# Patient Record
Sex: Female | Born: 1940 | Race: White | Hispanic: No | Marital: Married | Smoking: Never smoker
Health system: Southern US, Community
[De-identification: ages and names within clinical notes are randomized; demographics above are authoritative.]

## PROBLEM LIST (undated history)

## (undated) DIAGNOSIS — Z9889 Other specified postprocedural states: Secondary | ICD-10-CM

## (undated) DIAGNOSIS — T8859XA Other complications of anesthesia, initial encounter: Secondary | ICD-10-CM

## (undated) DIAGNOSIS — I10 Essential (primary) hypertension: Secondary | ICD-10-CM

## (undated) DIAGNOSIS — C801 Malignant (primary) neoplasm, unspecified: Secondary | ICD-10-CM

## (undated) DIAGNOSIS — E785 Hyperlipidemia, unspecified: Secondary | ICD-10-CM

## (undated) DIAGNOSIS — R112 Nausea with vomiting, unspecified: Secondary | ICD-10-CM

## (undated) DIAGNOSIS — T4145XA Adverse effect of unspecified anesthetic, initial encounter: Secondary | ICD-10-CM

## (undated) HISTORY — PX: WRIST SURGERY: SHX841

---

## 2006-07-02 ENCOUNTER — Other Ambulatory Visit: Admission: RE | Admit: 2006-07-02 | Discharge: 2006-07-02 | Payer: Self-pay | Admitting: Family Medicine

## 2009-03-06 ENCOUNTER — Other Ambulatory Visit: Admission: RE | Admit: 2009-03-06 | Discharge: 2009-03-06 | Payer: Self-pay | Admitting: Family Medicine

## 2012-10-24 ENCOUNTER — Emergency Department (HOSPITAL_COMMUNITY): Payer: Medicare Other

## 2012-10-24 ENCOUNTER — Emergency Department (HOSPITAL_COMMUNITY)
Admission: EM | Admit: 2012-10-24 | Discharge: 2012-10-24 | Disposition: A | Discharge status: Home / Self Care | Payer: Medicare Other | Attending: Emergency Medicine | Admitting: Emergency Medicine

## 2012-10-24 ENCOUNTER — Encounter (HOSPITAL_COMMUNITY): Payer: Self-pay | Admitting: *Deleted

## 2012-10-24 DIAGNOSIS — Z7982 Long term (current) use of aspirin: Secondary | ICD-10-CM | POA: Insufficient documentation

## 2012-10-24 DIAGNOSIS — E785 Hyperlipidemia, unspecified: Secondary | ICD-10-CM | POA: Insufficient documentation

## 2012-10-24 DIAGNOSIS — Z79899 Other long term (current) drug therapy: Secondary | ICD-10-CM | POA: Insufficient documentation

## 2012-10-24 DIAGNOSIS — Y939 Activity, unspecified: Secondary | ICD-10-CM | POA: Insufficient documentation

## 2012-10-24 DIAGNOSIS — I10 Essential (primary) hypertension: Secondary | ICD-10-CM | POA: Insufficient documentation

## 2012-10-24 DIAGNOSIS — Y929 Unspecified place or not applicable: Secondary | ICD-10-CM | POA: Insufficient documentation

## 2012-10-24 DIAGNOSIS — S52609A Unspecified fracture of lower end of unspecified ulna, initial encounter for closed fracture: Secondary | ICD-10-CM

## 2012-10-24 HISTORY — DX: Hyperlipidemia, unspecified: E78.5

## 2012-10-24 HISTORY — DX: Essential (primary) hypertension: I10

## 2012-10-24 LAB — BASIC METABOLIC PANEL
BUN: 17 mg/dL (ref 6–23)
Calcium: 9.5 mg/dL (ref 8.4–10.5)
Creatinine, Ser: 0.87 mg/dL (ref 0.50–1.10)
GFR calc Af Amer: 76 mL/min — ABNORMAL LOW (ref >90)
GFR calc non Af Amer: 65 mL/min — ABNORMAL LOW (ref >90)
Glucose, Bld: 106 mg/dL — ABNORMAL HIGH (ref 70–99)
Potassium: 3.6 mEq/L (ref 3.5–5.1)

## 2012-10-24 LAB — CBC WITH DIFFERENTIAL/PLATELET
Basophils Relative: 0 % (ref 0–1)
Eosinophils Absolute: 0 10*3/uL (ref 0.0–0.7)
Eosinophils Relative: 0 % (ref 0–5)
Hemoglobin: 14.4 g/dL (ref 12.0–15.0)
Lymphs Abs: 1.3 10*3/uL (ref 0.7–4.0)
MCH: 29.9 pg (ref 26.0–34.0)
MCHC: 34.7 g/dL (ref 30.0–36.0)
MCV: 86.3 fL (ref 78.0–100.0)
Monocytes Absolute: 0.6 10*3/uL (ref 0.1–1.0)
Monocytes Relative: 5 % (ref 3–12)
Neutrophils Relative %: 85 % — ABNORMAL HIGH (ref 43–77)
RBC: 4.81 MIL/uL (ref 3.87–5.11)

## 2012-10-24 MED ORDER — MORPHINE SULFATE 2 MG/ML IJ SOLN
1.0000 mg | Freq: Once | INTRAMUSCULAR | Status: AC
  Administered 2012-10-24: 1 mg via INTRAVENOUS
  Filled 2012-10-24: qty 1

## 2012-10-24 MED ORDER — ONDANSETRON HCL 4 MG PO TABS: 4.0000 mg | ORAL_TABLET | Freq: Four times a day (QID) | ORAL | Status: DC

## 2012-10-24 MED ORDER — HYDROCODONE-ACETAMINOPHEN 5-500 MG PO TABS: 1.0000 | ORAL_TABLET | Freq: Four times a day (QID) | ORAL | Status: DC | PRN

## 2012-10-24 MED ORDER — ONDANSETRON HCL 4 MG/2ML IJ SOLN
4.0000 mg | Freq: Once | INTRAMUSCULAR | Status: AC
  Administered 2012-10-24: 4 mg via INTRAVENOUS
  Filled 2012-10-24: qty 2

## 2012-10-24 NOTE — Progress Notes (Signed)
Orthopedic Tech Progress Note Patient Details:  Norma Simon 1940/12/27 [id number]  Ortho Devices Type of Ortho Device: Long arm splint;Arm foam sling Ortho Device/Splint Location: right arm Ortho Device/Splint Interventions: Application   Norma Simon 10/24/2012, 4:16 PM

## 2012-10-24 NOTE — ED Notes (Signed)
The pt has bruising and swelling to the rt wrist and hand with scattered abrasions.  Ring removed from her rt ring finger and given to the daughter.  Ice remains on the  Hand and forearm
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
## 2012-10-25 NOTE — ED Provider Notes (Signed)
Medical screening examination/treatment/procedure(s) were performed by non-physician practitioner and as supervising physician I was immediately available for consultation/collaboration.  Ethelda Chick, MD 10/25/12 [phone]

## 2012-12-15 ENCOUNTER — Ambulatory Visit: Payer: Medicare Other | Attending: Orthopedic Surgery | Admitting: Physical Therapy

## 2012-12-15 DIAGNOSIS — IMO0001 Reserved for inherently not codable concepts without codable children: Secondary | ICD-10-CM | POA: Insufficient documentation

## 2012-12-15 DIAGNOSIS — M25539 Pain in unspecified wrist: Secondary | ICD-10-CM | POA: Insufficient documentation

## 2012-12-15 DIAGNOSIS — M25639 Stiffness of unspecified wrist, not elsewhere classified: Secondary | ICD-10-CM | POA: Insufficient documentation

## 2012-12-22 ENCOUNTER — Ambulatory Visit: Payer: Medicare Other | Attending: Orthopedic Surgery | Admitting: Physical Therapy

## 2012-12-22 DIAGNOSIS — IMO0001 Reserved for inherently not codable concepts without codable children: Secondary | ICD-10-CM | POA: Insufficient documentation

## 2012-12-22 DIAGNOSIS — M25639 Stiffness of unspecified wrist, not elsewhere classified: Secondary | ICD-10-CM | POA: Insufficient documentation

## 2012-12-22 DIAGNOSIS — M25539 Pain in unspecified wrist: Secondary | ICD-10-CM | POA: Insufficient documentation

## 2012-12-28 ENCOUNTER — Ambulatory Visit: Payer: Medicare Other | Admitting: Physical Therapy

## 2013-01-04 ENCOUNTER — Ambulatory Visit: Payer: Medicare Other | Admitting: Physical Therapy

## 2016-06-10 DIAGNOSIS — I129 Hypertensive chronic kidney disease with stage 1 through stage 4 chronic kidney disease, or unspecified chronic kidney disease: Secondary | ICD-10-CM | POA: Diagnosis not present

## 2016-06-10 DIAGNOSIS — N183 Chronic kidney disease, stage 3 (moderate): Secondary | ICD-10-CM | POA: Diagnosis not present

## 2016-06-10 DIAGNOSIS — E785 Hyperlipidemia, unspecified: Secondary | ICD-10-CM | POA: Diagnosis not present

## 2016-10-08 DIAGNOSIS — Z23 Encounter for immunization: Secondary | ICD-10-CM | POA: Diagnosis not present

## 2016-12-10 DIAGNOSIS — M8580 Other specified disorders of bone density and structure, unspecified site: Secondary | ICD-10-CM | POA: Diagnosis not present

## 2016-12-10 DIAGNOSIS — Z0000 Encounter for general adult medical examination without abnormal findings: Secondary | ICD-10-CM | POA: Diagnosis not present

## 2016-12-10 DIAGNOSIS — N183 Chronic kidney disease, stage 3 (moderate): Secondary | ICD-10-CM | POA: Diagnosis not present

## 2016-12-10 DIAGNOSIS — E559 Vitamin D deficiency, unspecified: Secondary | ICD-10-CM | POA: Diagnosis not present

## 2016-12-10 DIAGNOSIS — I129 Hypertensive chronic kidney disease with stage 1 through stage 4 chronic kidney disease, or unspecified chronic kidney disease: Secondary | ICD-10-CM | POA: Diagnosis not present

## 2016-12-10 DIAGNOSIS — E785 Hyperlipidemia, unspecified: Secondary | ICD-10-CM | POA: Diagnosis not present

## 2016-12-10 DIAGNOSIS — Z1211 Encounter for screening for malignant neoplasm of colon: Secondary | ICD-10-CM | POA: Diagnosis not present

## 2016-12-25 DIAGNOSIS — Z1211 Encounter for screening for malignant neoplasm of colon: Secondary | ICD-10-CM | POA: Diagnosis not present

## 2017-06-07 DIAGNOSIS — N183 Chronic kidney disease, stage 3 (moderate): Secondary | ICD-10-CM | POA: Diagnosis not present

## 2017-06-07 DIAGNOSIS — Z23 Encounter for immunization: Secondary | ICD-10-CM | POA: Diagnosis not present

## 2017-06-07 DIAGNOSIS — I129 Hypertensive chronic kidney disease with stage 1 through stage 4 chronic kidney disease, or unspecified chronic kidney disease: Secondary | ICD-10-CM | POA: Diagnosis not present

## 2017-06-07 DIAGNOSIS — E785 Hyperlipidemia, unspecified: Secondary | ICD-10-CM | POA: Diagnosis not present

## 2017-10-05 DIAGNOSIS — Z23 Encounter for immunization: Secondary | ICD-10-CM | POA: Diagnosis not present

## 2018-01-01 DIAGNOSIS — Z1231 Encounter for screening mammogram for malignant neoplasm of breast: Secondary | ICD-10-CM | POA: Diagnosis not present

## 2018-01-01 DIAGNOSIS — Z803 Family history of malignant neoplasm of breast: Secondary | ICD-10-CM | POA: Diagnosis not present

## 2018-01-04 DIAGNOSIS — I129 Hypertensive chronic kidney disease with stage 1 through stage 4 chronic kidney disease, or unspecified chronic kidney disease: Secondary | ICD-10-CM | POA: Diagnosis not present

## 2018-01-04 DIAGNOSIS — Z0000 Encounter for general adult medical examination without abnormal findings: Secondary | ICD-10-CM | POA: Diagnosis not present

## 2018-01-04 DIAGNOSIS — N183 Chronic kidney disease, stage 3 (moderate): Secondary | ICD-10-CM | POA: Diagnosis not present

## 2018-01-04 DIAGNOSIS — E785 Hyperlipidemia, unspecified: Secondary | ICD-10-CM | POA: Diagnosis not present

## 2018-01-10 DIAGNOSIS — N6324 Unspecified lump in the left breast, lower inner quadrant: Secondary | ICD-10-CM | POA: Diagnosis not present

## 2018-01-19 ENCOUNTER — Other Ambulatory Visit: Payer: Self-pay | Admitting: Radiology

## 2018-01-19 DIAGNOSIS — C50312 Malignant neoplasm of lower-inner quadrant of left female breast: Secondary | ICD-10-CM | POA: Diagnosis not present

## 2018-01-19 DIAGNOSIS — N6324 Unspecified lump in the left breast, lower inner quadrant: Secondary | ICD-10-CM | POA: Diagnosis not present

## 2018-01-21 ENCOUNTER — Telehealth: Payer: Self-pay | Admitting: Hematology and Oncology

## 2018-01-21 ENCOUNTER — Encounter: Payer: Self-pay | Admitting: *Deleted

## 2018-01-21 NOTE — Telephone Encounter (Signed)
LVM for patient for [facility] on 01/26/18

## 2018-01-25 ENCOUNTER — Other Ambulatory Visit: Payer: Self-pay | Admitting: *Deleted

## 2018-01-25 DIAGNOSIS — C50312 Malignant neoplasm of lower-inner quadrant of left female breast: Secondary | ICD-10-CM

## 2018-01-25 DIAGNOSIS — Z170 Estrogen receptor positive status [ER+]: Principal | ICD-10-CM

## 2018-01-26 ENCOUNTER — Encounter: Payer: Self-pay | Admitting: Physical Therapy

## 2018-01-26 ENCOUNTER — Ambulatory Visit: Payer: Medicare Other | Attending: General Surgery | Admitting: Physical Therapy

## 2018-01-26 ENCOUNTER — Inpatient Hospital Stay: Payer: Medicare Other | Attending: Hematology and Oncology | Admitting: Hematology and Oncology

## 2018-01-26 ENCOUNTER — Other Ambulatory Visit: Payer: Self-pay

## 2018-01-26 ENCOUNTER — Inpatient Hospital Stay: Payer: Medicare Other

## 2018-01-26 ENCOUNTER — Other Ambulatory Visit: Payer: Self-pay | Admitting: *Deleted

## 2018-01-26 ENCOUNTER — Ambulatory Visit
Admission: RE | Admit: 2018-01-26 | Discharge: 2018-01-26 | Disposition: A | Discharge status: Home / Self Care | Payer: Medicare Other | Source: Ambulatory Visit | Attending: Radiation Oncology | Admitting: Radiation Oncology

## 2018-01-26 ENCOUNTER — Encounter: Payer: Self-pay | Admitting: Hematology and Oncology

## 2018-01-26 ENCOUNTER — Encounter: Payer: Self-pay | Admitting: *Deleted

## 2018-01-26 DIAGNOSIS — Z803 Family history of malignant neoplasm of breast: Secondary | ICD-10-CM | POA: Diagnosis not present

## 2018-01-26 DIAGNOSIS — C50312 Malignant neoplasm of lower-inner quadrant of left female breast: Secondary | ICD-10-CM

## 2018-01-26 DIAGNOSIS — I10 Essential (primary) hypertension: Secondary | ICD-10-CM | POA: Diagnosis not present

## 2018-01-26 DIAGNOSIS — Z170 Estrogen receptor positive status [ER+]: Principal | ICD-10-CM

## 2018-01-26 DIAGNOSIS — Z79899 Other long term (current) drug therapy: Secondary | ICD-10-CM | POA: Diagnosis not present

## 2018-01-26 DIAGNOSIS — Z809 Family history of malignant neoplasm, unspecified: Secondary | ICD-10-CM

## 2018-01-26 DIAGNOSIS — Z7982 Long term (current) use of aspirin: Secondary | ICD-10-CM | POA: Insufficient documentation

## 2018-01-26 DIAGNOSIS — R293 Abnormal posture: Secondary | ICD-10-CM | POA: Diagnosis present

## 2018-01-26 DIAGNOSIS — E785 Hyperlipidemia, unspecified: Secondary | ICD-10-CM | POA: Insufficient documentation

## 2018-01-26 LAB — CBC WITH DIFFERENTIAL (CANCER CENTER ONLY)
Basophils Absolute: 0 10*3/uL (ref 0.0–0.1)
Basophils Relative: 0 %
Eosinophils Absolute: 0.1 10*3/uL (ref 0.0–0.5)
Eosinophils Relative: 1 %
HEMATOCRIT: 41.8 % (ref 34.8–46.6)
Hemoglobin: 13.8 g/dL (ref 11.6–15.9)
LYMPHS ABS: 1.2 10*3/uL (ref 0.9–3.3)
LYMPHS PCT: 16 %
MCH: 29.4 pg (ref 25.1–34.0)
MCHC: 32.9 g/dL (ref 31.5–36.0)
MCV: 89.4 fL (ref 79.5–101.0)
MONO ABS: 0.6 10*3/uL (ref 0.1–0.9)
MONOS PCT: 8 %
NEUTROS ABS: 5.6 10*3/uL (ref 1.5–6.5)
Neutrophils Relative %: 75 %
Platelet Count: 263 10*3/uL (ref 145–400)
RBC: 4.68 MIL/uL (ref 3.70–5.45)
RDW: 13.6 % (ref 11.2–14.5)
WBC Count: 7.4 10*3/uL (ref 3.9–10.3)

## 2018-01-26 LAB — CMP (CANCER CENTER ONLY)
ALBUMIN: 3.8 g/dL (ref 3.5–5.0)
ALT: 14 U/L (ref 0–55)
ANION GAP: 10 (ref 3–11)
AST: 15 U/L (ref 5–34)
Alkaline Phosphatase: 48 U/L (ref 40–150)
BUN: 20 mg/dL (ref 7–26)
CHLORIDE: 105 mmol/L (ref 98–109)
CO2: 27 mmol/L (ref 22–29)
Calcium: 9.5 mg/dL (ref 8.4–10.4)
Creatinine: 0.97 mg/dL (ref 0.60–1.10)
GFR, Est AFR Am: >60 mL/min (ref >60)
GFR, Estimated: 55 mL/min — ABNORMAL LOW (ref >60)
GLUCOSE: 131 mg/dL (ref 70–140)
POTASSIUM: 3.5 mmol/L (ref 3.5–5.1)
SODIUM: 142 mmol/L (ref 136–145)
Total Bilirubin: 0.5 mg/dL (ref 0.2–1.2)
Total Protein: 7.3 g/dL (ref 6.4–8.3)

## 2018-01-26 NOTE — Progress Notes (Signed)
Nutrition Assessment  Reason for Assessment:  Pt seen in Breast Clinic  ASSESSMENT:   77 year old female with new diagnosis of breast cancer.  Past medical history of HTN, high cholesterol.    Patient reports normal appetite and stable weight.   Medications:  reviewed  Labs: reviewed  Anthropometrics:   Height: 64 inches Weight: 145 lb 14.4 oz BMI: 25   NUTRITION DIAGNOSIS: Food and nutrition related knowledge deficit related to new diagnosis of breast cancer as evidenced by no prior need for nutrition related information.  INTERVENTION:   Discussed and provided packet of information regarding nutritional tips for breast cancer patients.  Questions answered.  Teachback method used.  Contact information provided and patient knows to contact me with questions/concerns.    MONITORING, EVALUATION, and GOAL: Pt will consume a healthy plant based diet to maintain lean body mass throughout treatment.   Maurya Nethery B. Zenia Resides, [place], [place] Registered Dietitian [phone] (pager)

## 2018-01-26 NOTE — Progress Notes (Signed)
[facility] CONSULT NOTE  Patient Care Team: Harlan Stains, MD as PCP - General (Family Medicine)  CHIEF COMPLAINTS/PURPOSE OF CONSULTATION:  Newly diagnosed breast cancer  HISTORY OF PRESENTING ILLNESS:  Norma Simon 77 y.o. female is here because of recent diagnosis of left breast cancer.  Patient's mother had breast cancer at age 31 and 80.  She died of stomach cancer at age 70.  Patient had a screening mammogram that detected a left breast mass that was ill-defined of the lower inner quadrant measuring 3 cm in size by ultrasound at 8 o'clock position.  Axilla was negative.  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  She was presented this morning in the multidisciplinary tumor board and she is here today to discuss her treatment plan.  I reviewed her records extensively and collaborated the history with the patient.  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 Hyperlipemia   . Hypertension     SURGICAL HISTORY: Past Surgical History:  Procedure Laterality Date  . WRIST SURGERY      SOCIAL HISTORY: Social History   Socioeconomic History  . Marital status: Married    Spouse name: Not on file  . Number of children: Not on file  . Years of education: Not on file  . Highest education level: Not on file  Social Needs  . Financial resource strain: Not on file  . Food insecurity - worry: Not on file  . Food insecurity - inability: Not on file  .  Transportation needs - medical: Not on file  . Transportation needs - non-medical: Not on file  Occupational History  . Not on file  Tobacco Use  . Smoking status: Never Smoker  Substance and Sexual Activity  . Alcohol use: No  . Drug use: No  . Sexual activity: Not on file  Other Topics Concern  . Not on file  Social History Narrative  . Not on file    FAMILY HISTORY: Family History  Problem Relation Age of Onset  . Breast cancer Mother   . Lung cancer Father     ALLERGIES:  has No Known Allergies.  MEDICATIONS:  Current Outpatient Medications  Medication Sig Dispense Refill  . amLODipine (NORVASC) 5 MG tablet Take 2.5 mg by mouth daily.     Marland Kitchen aspirin EC 81 MG tablet Take 81 mg by mouth daily.    . benazepril-hydrochlorthiazide (LOTENSIN HCT) 20-25 MG per tablet Take 1 tablet by mouth daily.    . calcium carbonate (OS-CAL) 600 MG TABS Take 600 mg by mouth daily.    . cholecalciferol (VITAMIN D) 1000 UNITS tablet Take 1,000 Units by mouth daily.    . Multiple Vitamin (MULTIVITAMIN WITH MINERALS) TABS Take 1 tablet by mouth daily.    . pravastatin (PRAVACHOL) 40 MG tablet Take 40 mg by mouth every evening.    . vitamin E 400 UNIT capsule Take 400 Units by mouth daily.     No current facility-administered medications  for this visit.     REVIEW OF SYSTEMS:   Constitutional: Denies fevers, chills or abnormal night sweats Eyes: Denies blurriness of vision, double vision or watery eyes Ears, nose, mouth, throat, and face: Denies mucositis or sore throat Respiratory: Denies cough, dyspnea or wheezes Cardiovascular: Denies palpitation, chest discomfort or lower extremity swelling Gastrointestinal:  Denies nausea, heartburn or change in bowel habits Skin: Denies abnormal skin rashes Lymphatics: Denies new lymphadenopathy or easy bruising Neurological:Denies numbness, tingling or new weaknesses Behavioral/Psych: Mood is stable, no new changes  Breast:  Denies any palpable  lumps or discharge All other systems were reviewed with the patient and are negative.  PHYSICAL EXAMINATION: ECOG PERFORMANCE STATUS: 1 - Symptomatic but completely ambulatory  Vitals:   01/26/18 0833  BP: 137/87  Pulse: 79  Resp: 17  Temp: 97.8 F (36.6 C)  SpO2: 100%   Filed Weights   01/26/18 0833  Weight: 145 lb 14.4 oz (66.2 kg)    GENERAL:alert, no distress and comfortable SKIN: skin color, texture, turgor are normal, no rashes or significant lesions EYES: normal, conjunctiva are pink and non-injected, sclera clear OROPHARYNX:no exudate, no erythema and lips, buccal mucosa, and tongue normal  NECK: supple, thyroid normal size, non-tender, without nodularity LYMPH:  no palpable lymphadenopathy in the cervical, axillary or inguinal LUNGS: clear to auscultation and percussion with normal breathing effort HEART: regular rate & rhythm and no murmurs and no lower extremity edema ABDOMEN:abdomen soft, non-tender and normal bowel sounds Musculoskeletal:no cyanosis of digits and no clubbing  PSYCH: alert & oriented x 3 with fluent speech NEURO: no focal motor/sensory deficits BREAST: No palpable nodules in breast. No palpable axillary or supraclavicular lymphadenopathy (exam performed in the presence of a chaperone)   LABORATORY DATA:  I have reviewed the data as listed Lab Results  Component Value Date   WBC 7.4 01/26/2018   HGB 14.4 10/24/2012   HCT 41.8 01/26/2018   MCV 89.4 01/26/2018   PLT 263 01/26/2018   Lab Results  Component Value Date   NA 142 01/26/2018   K 3.5 01/26/2018   CL 105 01/26/2018   CO2 27 01/26/2018    RADIOGRAPHIC STUDIES: I have personally reviewed the radiological reports and agreed with the findings in the report.  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 After reviewing the pathology in detail, we proceeded to discuss the different treatment options between surgery, radiation, chemotherapy, antiestrogen therapies.  Recommendations: Breast MRI will be obtained Genetics will be consulted  1. Breast conserving surgery followed by 2. Oncotype DX testing to determine if chemotherapy would be of any benefit followed by 3. Adjuvant radiation therapy followed by 4. Adjuvant antiestrogen therapy  Oncotype counseling: I discussed Oncotype DX test. I explained to the patient that this is a 21 gene panel to evaluate patient tumors DNA to calculate recurrence score. This would help determine whether patient has high risk or intermediate risk or low risk breast cancer. She understands that if her tumor was found to be high risk, she  would benefit from systemic chemotherapy. If low risk, no need of chemotherapy. If she was found to be intermediate risk, we would need to evaluate the score as well as other risk factors and determine if an abbreviated chemotherapy may be of benefit.  Return to clinic after surgery to discuss final pathology report and then determine if Oncotype DX testing will need to be sent.  All questions were answered. The patient knows to call the clinic with any problems, questions or concerns.    Harriette Ohara, MD 01/26/18

## 2018-01-26 NOTE — Progress Notes (Signed)
Clinical Social Work [place] Psychosocial Distress Screening [place]  Patient completed distress screening protocol and scored a 0 on the Psychosocial Distress Thermometer which indicates no distress. Clinical Social Worker met with patient and patients husband in [facility] to assess for distress and other psychosocial needs. Patient stated she was feeling overwhelmed but felt "better" after meeting with the treatment team and getting more information on her treatment plan. CSW and patient discussed common feeling and emotions when being diagnosed with cancer, and the importance of support during treatment. CSW informed patient of the support team and support services at [facility] CSW provided contact information and encouraged patient to call with any questions or concerns.  ONCBCN DISTRESS SCREENING 01/26/2018  Screening Type Initial Screening  Distress experienced in past week (1-10) 0  Information Concerns Type Lack of info about diagnosis     Johnnye Lana, MSW, LCSW, OSW-C Clinical Social Worker [place] [phone]

## 2018-01-26 NOTE — Progress Notes (Signed)
Radiation Oncology         [phone] ________________________________  Name: Norma Simon        MRN: [id number]  Date of Service: 01/26/2018 DOB: 1941/12/08  CC:Harlan Stains, MD  Rolm Bookbinder, MD     REFERRING PHYSICIAN: Rolm Bookbinder, MD   DIAGNOSIS: The encounter diagnosis was Malignant neoplasm of lower-inner quadrant of left breast in female, estrogen receptor positive ([place]).   HISTORY OF PRESENT ILLNESS: Norma Simon is a 77 y.o. female seen in the multidisciplinary breast clinic for a new diagnosis of left breast cancer. The patient was noted to have a screening detected mass on 01/01/18 in the lower inner quadrant of the left breast. Diagnostic imaging revealed a 3 cm mass at 8:00. 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 She comes today to discuss options of treatment for her cancer.    PREVIOUS RADIATION THERAPY: No   PAST MEDICAL HISTORY:  Past Medical History:  Diagnosis Date  . Hyperlipemia   . Hypertension        PAST SURGICAL HISTORY: Past Surgical History:  Procedure Laterality Date  . WRIST SURGERY       FAMILY HISTORY:  Family History  Problem Relation Age of Onset  . Breast cancer Mother   . Lung cancer Father      SOCIAL HISTORY:  reports that  has never smoked. She does not have any smokeless tobacco history on file. She reports that she does not drink alcohol or use drugs. The patient is married and lives in [place]. She enjoys playing bridge.   ALLERGIES: Patient has no known allergies.   MEDICATIONS:  Current Outpatient Medications  Medication Sig Dispense Refill  . amLODipine (NORVASC) 5 MG tablet Take 2.5 mg by mouth daily.     Marland Kitchen aspirin EC 81 MG tablet Take 81 mg by mouth daily.    . benazepril-hydrochlorthiazide (LOTENSIN HCT) 20-25 MG per tablet Take 1 tablet by mouth  daily.    . calcium carbonate (OS-CAL) 600 MG TABS Take 600 mg by mouth daily.    . cholecalciferol (VITAMIN D) 1000 UNITS tablet Take 1,000 Units by mouth daily.    . Multiple Vitamin (MULTIVITAMIN WITH MINERALS) TABS Take 1 tablet by mouth daily.    . pravastatin (PRAVACHOL) 40 MG tablet Take 40 mg by mouth every evening.    . vitamin E 400 UNIT capsule Take 400 Units by mouth daily.     No current facility-administered medications for this encounter.      REVIEW OF SYSTEMS: On review of systems, the patient reports that she is doing well overall. She denies any chest pain, shortness of breath, cough, fevers, chills, night sweats, unintended weight changes. She denies any bowel or bladder disturbances, and denies abdominal pain, nausea or vomiting. She denies any new musculoskeletal or joint aches or pains. A complete review of systems is obtained and is otherwise negative.     PHYSICAL EXAM:  Wt Readings from Last 3 Encounters:  01/26/18 145 lb 14.4 oz (66.2 kg)   Temp Readings from Last 3 Encounters:  01/26/18 97.8 F (36.6 C) (Oral)  10/24/12 97.1 F (36.2 C) (Oral)   BP Readings from Last 3 Encounters:  01/26/18 137/87  10/24/12 146/85   Pulse Readings from Last 3 Encounters:  01/26/18 79     In general this is a well appearing  caucasian female in no acute distress. She is alert and oriented x4 and appropriate throughout the examination. HEENT reveals that the patient is normocephalic, atraumatic. EOMs are intact. PERRLA. Skin is intact without any evidence of gross lesions. Cardiovascular exam reveals a regular rate and rhythm, no clicks rubs or murmurs are auscultated. Chest is clear to auscultation bilaterally. Lymphatic assessment is performed and does not reveal any adenopathy in the cervical, supraclavicular, axillary, or inguinal chains. Bilateral breast exam is performed and reveals fullness and ecchymosis in the lower inner quadrant of the left breast. No nipple  bleeding or discharge is noted of either breast, and no masses are noted in the right breast.  Abdomen has active bowel sounds in all quadrants and is intact. The abdomen is soft, non tender, non distended. Lower extremities are negative for pretibial pitting edema, deep calf tenderness, cyanosis or clubbing.   ECOG = 0  0 - Asymptomatic (Fully active, able to carry on all predisease activities without restriction)  1 - Symptomatic but completely ambulatory (Restricted in physically strenuous activity but ambulatory and able to carry out work of a light or sedentary nature. For example, light housework, office work)  2 - Symptomatic, <50% in bed during the day (Ambulatory and capable of all self care but unable to carry out any work activities. Up and about more than 50% of waking hours)  3 - Symptomatic, >50% in bed, but not bedbound (Capable of only limited self-care, confined to bed or chair 50% or more of waking hours)  4 - Bedbound (Completely disabled. Cannot carry on any self-care. Totally confined to bed or chair)  5 - Death   Eustace Pen MM, Creech RH, Tormey DC, et al. [phone]). "Toxicity and response criteria of the [facility]". [place] Oncol. 5 (6): 649-55    LABORATORY DATA:  Lab Results  Component Value Date   WBC 7.4 01/26/2018   HGB 14.4 10/24/2012   HCT 41.8 01/26/2018   MCV 89.4 01/26/2018   PLT 263 01/26/2018   Lab Results  Component Value Date   NA 142 01/26/2018   K 3.5 01/26/2018   CL 105 01/26/2018   CO2 27 01/26/2018   Lab Results  Component Value Date   ALT 14 01/26/2018   AST 15 01/26/2018   ALKPHOS 48 01/26/2018   BILITOT 0.5 01/26/2018      RADIOGRAPHY: No results found.     IMPRESSION/PLAN: 1. 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 Dr. Lisbeth Renshaw discusses the pathology findings and reviews the nature of left breast disease. The consensus from the breast conference included  MRI to understand extent of disease. We reviewed the options for consideration of mastectomy versus breast conservation with lumpectomy. Either approach would also include sentinel mapping. The patient is motivated to proceed with lumpectomy sentinel node. She would be a Dr. Lindi Adie does not anticipate a role for systemic chemotherapy. The patient's course would then be followed by external radiotherapy to the breast followed by antiestrogen therapy. We discussed the risks, benefits, short, and long term effects of radiotherapy, and the patient is interested in proceeding. Dr. Lisbeth Renshaw discusses the delivery and logistics of radiotherapy and anticipates a course of 4 weeks with deep inspiration breath hold technique. We will see her back about 2 weeks after surgery to move forward with the simulation and planning process and anticipate starting radiotherapy about 4 weeks after surgery.  2. Possible genetic predisposition to malignancy. The patient  is a candidate for genetic testing and will proceed with referral. We will follow this expectantly.   The above documentation reflects my direct findings during this shared patient visit. Please see the separate note by Dr. Lisbeth Renshaw on this date for the remainder of the patient's plan of care.    Carola Rhine, PAC

## 2018-01-26 NOTE — Patient Instructions (Signed)

## 2018-01-26 NOTE — Therapy (Signed)
[place], [place], [address] Phone: [phone]   Fax:  [phone]  Physical Therapy Evaluation  Patient Details  Name: Norma Simon MRN: [id number] Date of Birth: 1941/02/11 Referring Provider: Dr. Rolm Bookbinder   Encounter Date: 01/26/2018  PT End of Session - 01/26/18 1051    Visit Number  1    Number of Visits  2    Date for PT Re-Evaluation  03/23/18    PT Start Time  0915    PT Stop Time  0945    PT Time Calculation (min)  30 min    Activity Tolerance  Patient tolerated treatment well    Behavior During Therapy  [facility] for tasks assessed/performed       Past Medical History:  Diagnosis Date  . Hyperlipemia   . Hypertension     Past Surgical History:  Procedure Laterality Date  . WRIST SURGERY      There were no vitals filed for this visit.   Subjective Assessment - 01/26/18 1041    Subjective  Patient reports she is here today to be seen by her medical team for her newly diagnosed left breast cancer.    Patient is accompained by:  Family member    Pertinent History  Patient was diagnosed on 01/10/18 with left grade 1-2 invasive lobular carcinoma breast cancer. It measures 3 cm and is located in the lower inner quadrant. It is ER/PR positive and HEr2 negative with a Ki67 of 10%. She has no other medical problems.    Patient Stated Goals  Reduce lymphedema risk and learn post op shoulder ROM HEP    Currently in Pain?  No/denies         [facility] PT Assessment - 01/26/18 0001      Assessment   Medical Diagnosis  Left breast cancer    Referring Provider  Dr. Rolm Bookbinder    Onset Date/Surgical Date  01/10/18    Hand Dominance  Right    Prior Therapy  none      Precautions   Precautions  Other (comment)    Precaution Comments  active cancer      Restrictions   Weight Bearing Restrictions  No      Balance Screen   Has the patient fallen in the past 6 months  No    Has  the patient had a decrease in activity level because of a fear of falling?   No    Is the patient reluctant to leave their home because of a fear of falling?   No      Home Film/video editor residence    Living Arrangements  Spouse/significant other    Available Help at Discharge  Family      Prior Function   Level of [place]  Retired    Leisure  She reports she is active but does not exercise      Cognition   Overall Cognitive Status  Within Functional Limits for tasks assessed      Posture/Postural Control   Posture/Postural Control  Postural limitations    Postural Limitations  Rounded Shoulders;Forward head      ROM / Strength   AROM / PROM / Strength  AROM;Strength      AROM   AROM Assessment Site  Shoulder;Cervical    Right/Left Shoulder  Right;Left    Right Shoulder Extension  56 Degrees    Right  Shoulder Flexion  120 Degrees    Right Shoulder ABduction  148 Degrees    Right Shoulder Internal Rotation  77 Degrees    Right Shoulder External Rotation  65 Degrees    Left Shoulder Extension  45 Degrees    Left Shoulder Flexion  120 Degrees    Left Shoulder ABduction  143 Degrees    Left Shoulder Internal Rotation  72 Degrees    Left Shoulder External Rotation  84 Degrees    Cervical Flexion  WNL    Cervical Extension  WNL    Cervical - Right Side Bend  WNL    Cervical - Left Side Bend  WNL    Cervical - Right Rotation  WNL    Cervical - Left Rotation  WNL      Strength   Overall Strength  Within functional limits for tasks performed        LYMPHEDEMA/ONCOLOGY QUESTIONNAIRE - 01/26/18 1049      Type   Cancer Type  Left breast cancer      Lymphedema Assessments   Lymphedema Assessments  Upper extremities      Right Upper Extremity Lymphedema   10 cm Proximal to Olecranon Process  26 cm    Olecranon Process  23.2 cm    10 cm Proximal to Ulnar Styloid Process  20.3 cm    Just Proximal to Ulnar Styloid  Process  15 cm    Across Hand at [employer]  18.2 cm    At [place] of 2nd Digit  6.5 cm      Left Upper Extremity Lymphedema   10 cm Proximal to Olecranon Process  26.6 cm    Olecranon Process  23.3 cm    10 cm Proximal to Ulnar Styloid Process  20.3 cm    Just Proximal to Ulnar Styloid Process  14 cm    Across Hand at [employer]  17.9 cm    At [place] of 2nd Digit  6.3 cm          Objective measurements completed on examination: See above findings.    Patient was instructed today in a home exercise program today for post op shoulder range of motion. These included active assist shoulder flexion in sitting, scapular retraction, wall walking with shoulder abduction, and hands behind head external rotation.  She was encouraged to do these twice a day, holding 3 seconds and repeating 5 times when permitted by her physician.     PT Education - 01/26/18 1050    Education provided  Yes    Education Details  Lymphedema risk reduction and post op shoulder ROM HEP    Person(s) Educated  Patient;Spouse    Methods  Explanation;Demonstration;Handout    Comprehension  Returned demonstration;Verbalized understanding          PT Long Term Goals - 01/26/18 1056      PT LONG TERM GOAL #1   Title  Patient will demonstrate left shoulder function and ROM has returned to baseline post operatively.    Time  8    Period  Weeks    Status  New      Breast Clinic Goals - 01/26/18 1056      Patient will be able to verbalize understanding of pertinent lymphedema risk reduction practices relevant to her diagnosis specifically related to skin care.   Time  1    Period  Days    Status  Achieved      Patient will be  able to return demonstrate and/or verbalize understanding of the post-op home exercise program related to regaining shoulder range of motion.   Time  1    Period  Days    Status  Achieved      Patient will be able to verbalize understanding of the importance of attending the  postoperative After Breast Cancer Class for further lymphedema risk reduction education and therapeutic exercise.   Time  1    Period  Days    Status  Achieved            Plan - 01/26/18 1051    Clinical Impression Statement  Patient was diagnosed on 01/10/18 with left grade 1-2 invasive lobular carcinoma breast cancer. It measures 3 cm and is located in the lower inner quadrant. It is ER/PR positive and HEr2 negative with a Ki67 of 10%. She has no other medical problems. Her multidisciplinary medical team met prior to her assessments to determine a recommended treatment plan. She is planning to have a left lumpectomy and sentinel node biopsy followed by radiation and anti-estrogen therapy. She will benefit from post op PT reassessment to determine PT needs.    History and Personal Factors relevant to plan of care:  None    Clinical Presentation  Stable    Clinical Decision Making  Low    Rehab Potential  Excellent    Clinical Impairments Affecting Rehab Potential  None    PT Frequency  -- Eval and 1 f/u visit    PT Treatment/Interventions  ADLs/Self Care Home Management;Therapeutic exercise;Patient/family education    PT Next Visit Plan  Will f/u 3-4 weeks post op to reassess    PT Home Exercise Plan  Post op shoulder ROM HEP    Consulted and Agree with Plan of Care  Patient;Family member/caregiver    Family Member Consulted  Husband       Patient will benefit from skilled therapeutic intervention in order to improve the following deficits and impairments:  Pain, Decreased scar mobility, Postural dysfunction, Decreased range of motion, Decreased knowledge of precautions, Impaired UE functional use  Visit Diagnosis: Malignant neoplasm of lower-inner quadrant of left breast in female, estrogen receptor positive ([place]) - Plan: PT plan of care cert/re-cert  Abnormal posture - Plan: PT plan of care cert/re-cert     Problem List Patient Active Problem List   Diagnosis Date Noted  .  Malignant neoplasm of lower-inner quadrant of left breast in female, estrogen receptor positive ([place]) 01/25/2018    Annia Friendly, PT 01/26/18 10:59 AM  [place], [place], [address] Phone: [phone]   Fax:  [phone]  Name: Norma Simon MRN: [id number] Date of Birth: 1941/10/29

## 2018-01-26 NOTE — Assessment & Plan Note (Signed)
01/10/2018: Screening detected left breast mass ill-defined LIQ 3 cm at 8 o'clock position axilla negative, ultrasound biopsy: Invasive lobular cancer grade 1-2, ER 90%, PR 100%, Ki-67 10%, HER-2 negative, T2N0 stage Ib clinical stage AJCC 8  Pathology and radiology counseling:Discussed with the patient, the details of pathology including the type of breast cancer,the clinical staging, the significance of ER, PR and HER-2/neu receptors and the implications for treatment. After reviewing the pathology in detail, we proceeded to discuss the different treatment options between surgery, radiation, chemotherapy, antiestrogen therapies.  Recommendations: 1. Breast conserving surgery followed by 2. Oncotype DX testing to determine if chemotherapy would be of any benefit followed by 3. Adjuvant radiation therapy followed by 4. Adjuvant antiestrogen therapy  Oncotype counseling: I discussed Oncotype DX test. I explained to the patient that this is a 21 gene panel to evaluate patient tumors DNA to calculate recurrence score. This would help determine whether patient has high risk or intermediate risk or low risk breast cancer. She understands that if her tumor was found to be high risk, she would benefit from systemic chemotherapy. If low risk, no need of chemotherapy. If she was found to be intermediate risk, we would need to evaluate the score as well as other risk factors and determine if an abbreviated chemotherapy may be of benefit.  Return to clinic after surgery to discuss final pathology report and then determine if Oncotype DX testing will need to be sent.

## 2018-01-31 ENCOUNTER — Encounter: Payer: Self-pay | Admitting: Genetic Counselor

## 2018-01-31 ENCOUNTER — Other Ambulatory Visit: Payer: Self-pay

## 2018-02-01 ENCOUNTER — Ambulatory Visit (HOSPITAL_COMMUNITY)
Admission: RE | Admit: 2018-02-01 | Discharge: 2018-02-01 | Disposition: A | Discharge status: Home / Self Care | Payer: Medicare Other | Source: Ambulatory Visit | Attending: General Surgery | Admitting: General Surgery

## 2018-02-01 DIAGNOSIS — Z170 Estrogen receptor positive status [ER+]: Secondary | ICD-10-CM | POA: Diagnosis present

## 2018-02-01 DIAGNOSIS — C50312 Malignant neoplasm of lower-inner quadrant of left female breast: Secondary | ICD-10-CM

## 2018-02-01 MED ORDER — GADOBENATE DIMEGLUMINE 529 MG/ML IV SOLN
15.0000 mL | Freq: Once | INTRAVENOUS | Status: AC | PRN
  Administered 2018-02-01: 13 mL via INTRAVENOUS

## 2018-02-03 ENCOUNTER — Telehealth: Payer: Self-pay | Admitting: *Deleted

## 2018-02-03 NOTE — Telephone Encounter (Signed)
  Oncology Nurse Navigator Documentation  Navigator Location: [facility] (02/03/18 1000)   )Navigator Encounter Type: Telephone;[facility] Follow-up (02/03/18 1000) Telephone: Outgoing Call;Clinic/[facility] Follow-up (02/03/18 1000)                                                  Time Spent with Patient: 30 (02/03/18 1000)

## 2018-02-04 ENCOUNTER — Telehealth: Payer: Self-pay | Admitting: *Deleted

## 2018-02-04 NOTE — Telephone Encounter (Signed)
Patient is interested in taking neo anti-estrogen and repeating MRI in 3 and 6 months.  Confirmed appointment with Dr. Lindi Adie for 02/08/18 for 215pm.

## 2018-02-08 ENCOUNTER — Inpatient Hospital Stay (HOSPITAL_BASED_OUTPATIENT_CLINIC_OR_DEPARTMENT_OTHER): Payer: Medicare Other | Admitting: Hematology and Oncology

## 2018-02-08 ENCOUNTER — Telehealth: Payer: Self-pay | Admitting: Hematology and Oncology

## 2018-02-08 DIAGNOSIS — Z170 Estrogen receptor positive status [ER+]: Secondary | ICD-10-CM | POA: Diagnosis not present

## 2018-02-08 DIAGNOSIS — C50312 Malignant neoplasm of lower-inner quadrant of left female breast: Secondary | ICD-10-CM | POA: Diagnosis not present

## 2018-02-08 MED ORDER — LETROZOLE 2.5 MG PO TABS: 2.5000 mg | ORAL_TABLET | Freq: Every day | ORAL | 3 refills | Status: DC

## 2018-02-08 NOTE — Telephone Encounter (Signed)
Gave patient AVs and calendar of upcoming May appointments.  °

## 2018-02-08 NOTE — Assessment & Plan Note (Addendum)
01/10/2018: Screening detected left breast mass ill-defined LIQ 3 cm at 8 o'clock position axilla negative, ultrasound biopsy: Invasive lobular cancer grade 1-2, ER 90%, PR 100%, Ki-67 10%, HER-2 negative, T2N0 stage Ib clinical stage AJCC 8  MRI Breast 02/01/18: Mass and NME extends 4.3 X 2.1 X 2.2 cm, No abnormal LN  Patient is interested in neoadjuvant hormonal therapy to shrink the tumors. I discussed about the role of Oncotype Dx in determining whether chemo vs hormonal therapy would be better. Given her age and overall fav prognostic panel, we elected to forego on oncotype testing and start her on Letrozole  Letrozole Counseling: We discussed the risks and benefits of anti-estrogen therapy with aromatase inhibitors. These include but not limited to insomnia, hot flashes, mood changes, vaginal dryness, bone density loss, and weight gain. We strongly believe that the benefits far outweigh the risks. Patient understands these risks and consented to starting treatment.   Plan to assess her with mammogram and [place] in 3 months and follow up after that

## 2018-02-08 NOTE — Progress Notes (Signed)
Patient Care Team: Harlan Stains, MD as PCP - General (Family Medicine)  DIAGNOSIS:  Encounter Diagnosis  Name Primary?  . Malignant neoplasm of lower-inner quadrant of left breast in female, estrogen receptor positive ([place])     SUMMARY OF ONCOLOGIC HISTORY:   Malignant neoplasm of lower-inner quadrant of left breast in female, estrogen receptor positive ([place])   01/10/2018 Initial Diagnosis    Screening detected left breast mass ill-defined LIQ 3 cm at 8 o'clock position axilla negative, ultrasound biopsy: Invasive lobular cancer grade 1-2, ER 90%, PR 100%, Ki-67 10%, HER-2 negative, T2N0 stage Ib clinical stage AJCC 8      02/08/2018 -  Anti-estrogen oral therapy    Letrozole Neoadj therapy      CHIEF COMPLIANT: Follow-up to discuss neoadjuvant letrozole therapy  INTERVAL HISTORY: Debby Clyne is a 77 year old with above-mentioned history of left breast cancer who underwent a breast MRI that showed extensive mass and non-mass enhancement.  She was sent to [place] for discussion regarding neoadjuvant antiestrogen therapy.  REVIEW OF SYSTEMS:   Constitutional: Denies fevers, chills or abnormal weight loss Eyes: Denies blurriness of vision Ears, nose, mouth, throat, and face: Denies mucositis or sore throat Respiratory: Denies cough, dyspnea or wheezes Cardiovascular: Denies palpitation, chest discomfort Gastrointestinal:  Denies nausea, heartburn or change in bowel habits Skin: Denies abnormal skin rashes Lymphatics: Denies new lymphadenopathy or easy bruising Neurological:Denies numbness, tingling or new weaknesses Behavioral/Psych: Mood is stable, no new changes  Extremities: No lower extremity edema  All other systems were reviewed with the patient and are negative.  I have reviewed the past medical history, past surgical history, social history and family history with the patient and they are unchanged from previous note.  ALLERGIES:  has No Known  Allergies.  MEDICATIONS:  Current Outpatient Medications  Medication Sig Dispense Refill  . amLODipine (NORVASC) 5 MG tablet Take 2.5 mg by mouth daily.     Marland Kitchen aspirin EC 81 MG tablet Take 81 mg by mouth daily.    . benazepril-hydrochlorthiazide (LOTENSIN HCT) 20-25 MG per tablet Take 1 tablet by mouth daily.    . calcium carbonate (OS-CAL) 600 MG TABS Take 600 mg by mouth daily.    . cholecalciferol (VITAMIN D) 1000 UNITS tablet Take 1,000 Units by mouth daily.    . Multiple Vitamin (MULTIVITAMIN WITH MINERALS) TABS Take 1 tablet by mouth daily.    . pravastatin (PRAVACHOL) 40 MG tablet Take 40 mg by mouth every evening.    . vitamin E 400 UNIT capsule Take 400 Units by mouth daily.     No current facility-administered medications for this visit.     PHYSICAL EXAMINATION: ECOG PERFORMANCE STATUS: 1 - Symptomatic but completely ambulatory  Vitals:   02/08/18 1425  BP: (!) 136/103  Pulse: (!) 107  Resp: 18  Temp: 97.8 F (36.6 C)  SpO2: 99%   Filed Weights   02/08/18 1425  Weight: 143 lb 3.2 oz (65 kg)    GENERAL:alert, no distress and comfortable SKIN: skin color, texture, turgor are normal, no rashes or significant lesions EYES: normal, Conjunctiva are pink and non-injected, sclera clear OROPHARYNX:no exudate, no erythema and lips, buccal mucosa, and tongue normal  NECK: supple, thyroid normal size, non-tender, without nodularity LYMPH:  no palpable lymphadenopathy in the cervical, axillary or inguinal LUNGS: clear to auscultation and percussion with normal breathing effort HEART: regular rate & rhythm and no murmurs and no lower extremity edema ABDOMEN:abdomen soft, non-tender and normal bowel sounds MUSCULOSKELETAL:no cyanosis of  digits and no clubbing  NEURO: alert & oriented x 3 with fluent speech, no focal motor/sensory deficits EXTREMITIES: No lower extremity edema  LABORATORY DATA:  I have reviewed the data as listed CMP Latest Ref Rng & Units 01/26/2018  10/24/2012  Glucose 70 - 140 mg/dL 131 106(H)  BUN 7 - 26 mg/dL 20 17  Creatinine 0.60 - 1.10 mg/dL 0.97 0.87  Sodium 136 - 145 mmol/L 142 139  Potassium 3.5 - 5.1 mmol/L 3.5 3.6  Chloride 98 - 109 mmol/L 105 101  CO2 22 - 29 mmol/L 27 27  Calcium 8.4 - 10.4 mg/dL 9.5 9.5  Total Protein 6.4 - 8.3 g/dL 7.3 -  Total Bilirubin 0.2 - 1.2 mg/dL 0.5 -  Alkaline Phos 40 - 150 U/L 48 -  AST 5 - 34 U/L 15 -  ALT 0 - 55 U/L 14 -    Lab Results  Component Value Date   WBC 7.4 01/26/2018   HGB 14.4 10/24/2012   HCT 41.8 01/26/2018   MCV 89.4 01/26/2018   PLT 263 01/26/2018   NEUTROABS 5.6 01/26/2018    ASSESSMENT & PLAN:  Malignant neoplasm of lower-inner quadrant of left breast in female, estrogen receptor positive ([place]) 01/10/2018: Screening detected left breast mass ill-defined LIQ 3 cm at 8 o'clock position axilla negative, ultrasound biopsy: Invasive lobular cancer grade 1-2, ER 90%, PR 100%, Ki-67 10%, HER-2 negative, T2N0 stage Ib clinical stage AJCC 8  MRI Breast 02/01/18: Mass and NME extends 4.3 X 2.1 X 2.2 cm, No abnormal LN  Patient is interested in neoadjuvant hormonal therapy to shrink the tumors. I discussed about the role of Oncotype Dx in determining whether chemo vs hormonal therapy would be better. Given her age and overall fav prognostic panel, we elected to forego on oncotype testing and start her on Letrozole  Letrozole Counseling: We discussed the risks and benefits of anti-estrogen therapy with aromatase inhibitors. These include but not limited to insomnia, hot flashes, mood changes, vaginal dryness, bone density loss, and weight gain. We strongly believe that the benefits far outweigh the risks. Patient understands these risks and consented to starting treatment.   Plan to assess her with mammogram and [place] in 3 months and follow up after that    I spent 25 minutes talking to the patient of which more than half was spent in counseling and coordination of care.  No  orders of the defined types were placed in this encounter.  The patient has a good understanding of the overall plan. she agrees with it. she will call with any problems that may develop before the next visit here.   Harriette Ohara, MD 02/08/18

## 2018-04-20 DIAGNOSIS — C801 Malignant (primary) neoplasm, unspecified: Secondary | ICD-10-CM

## 2018-04-20 HISTORY — DX: Malignant (primary) neoplasm, unspecified: C80.1

## 2018-05-12 DIAGNOSIS — N6324 Unspecified lump in the left breast, lower inner quadrant: Secondary | ICD-10-CM | POA: Diagnosis not present

## 2018-05-12 DIAGNOSIS — R928 Other abnormal and inconclusive findings on diagnostic imaging of breast: Secondary | ICD-10-CM | POA: Diagnosis not present

## 2018-05-17 ENCOUNTER — Inpatient Hospital Stay: Payer: Medicare Other | Attending: Hematology and Oncology | Admitting: Hematology and Oncology

## 2018-05-17 ENCOUNTER — Telehealth: Payer: Self-pay | Admitting: Hematology and Oncology

## 2018-05-17 DIAGNOSIS — Z79811 Long term (current) use of aromatase inhibitors: Secondary | ICD-10-CM | POA: Diagnosis not present

## 2018-05-17 DIAGNOSIS — C50312 Malignant neoplasm of lower-inner quadrant of left female breast: Secondary | ICD-10-CM | POA: Diagnosis not present

## 2018-05-17 DIAGNOSIS — Z170 Estrogen receptor positive status [ER+]: Secondary | ICD-10-CM

## 2018-05-17 NOTE — Progress Notes (Signed)
Patient Care Team: Harlan Stains, MD as PCP - General (Family Medicine)  DIAGNOSIS:  Encounter Diagnosis  Name Primary?  . Malignant neoplasm of lower-inner quadrant of left breast in female, estrogen receptor positive ([place])     SUMMARY OF ONCOLOGIC HISTORY:   Malignant neoplasm of lower-inner quadrant of left breast in female, estrogen receptor positive ([place])   01/10/2018 Initial Diagnosis    Screening detected left breast mass ill-defined LIQ 3 cm at 8 o'clock position axilla negative, ultrasound biopsy: Invasive lobular cancer grade 1-2, ER 90%, PR 100%, Ki-67 10%, HER-2 negative, T2N0 stage Ib clinical stage AJCC 8      02/08/2018 -  Anti-estrogen oral therapy    Letrozole Neoadj therapy       CHIEF COMPLIANT: Follow-up after recent mammogram and ultrasound at [facility], currently neoadjuvant therapy  INTERVAL HISTORY: Norma Simon is a 77 year old with above-mentioned history of left breast cancer currently neoadjuvant antiestrogen therapy with letrozole.  She appears to be tolerating it extremely well.  She had recent mammogram and ultrasound that showed response to treatment.  Previously she had a 3 cm mass and it has now decreased to 2 cm in size.  She denies any hot flashes or arthralgias or myalgias  REVIEW OF SYSTEMS:   Constitutional: Denies fevers, chills or abnormal weight loss Eyes: Denies blurriness of vision Ears, nose, mouth, throat, and face: Denies mucositis or sore throat Respiratory: Denies cough, dyspnea or wheezes Cardiovascular: Denies palpitation, chest discomfort Gastrointestinal:  Denies nausea, heartburn or change in bowel habits Skin: Denies abnormal skin rashes Lymphatics: Denies new lymphadenopathy or easy bruising Neurological:Denies numbness, tingling or new weaknesses Behavioral/Psych: Mood is stable, no new changes  Extremities: No lower extremity edema  All other systems were reviewed with the patient and are negative.  I have reviewed  the past medical history, past surgical history, social history and family history with the patient and they are unchanged from previous note.  ALLERGIES:  has No Known Allergies.  MEDICATIONS:  Current Outpatient Medications  Medication Sig Dispense Refill  . amLODipine (NORVASC) 5 MG tablet Take 2.5 mg by mouth daily.     Marland Kitchen aspirin EC 81 MG tablet Take 81 mg by mouth daily.    . benazepril-hydrochlorthiazide (LOTENSIN HCT) 20-25 MG per tablet Take 1 tablet by mouth daily.    . calcium carbonate (OS-CAL) 600 MG TABS Take 600 mg by mouth daily.    . cholecalciferol (VITAMIN D) 1000 UNITS tablet Take 1,000 Units by mouth daily.    Marland Kitchen letrozole (FEMARA) 2.5 MG tablet Take 1 tablet (2.5 mg total) by mouth daily. 90 tablet 3  . Multiple Vitamin (MULTIVITAMIN WITH MINERALS) TABS Take 1 tablet by mouth daily.    . pravastatin (PRAVACHOL) 40 MG tablet Take 40 mg by mouth every evening.    . vitamin E 400 UNIT capsule Take 400 Units by mouth daily.     No current facility-administered medications for this visit.     PHYSICAL EXAMINATION: ECOG PERFORMANCE STATUS: 1 - Symptomatic but completely ambulatory  Vitals:   05/17/18 1455  BP: (!) 156/69  Pulse: 64  Resp: 17  Temp: 98.6 F (37 C)  SpO2: 100%   Filed Weights   05/17/18 1455  Weight: 144 lb 12.8 oz (65.7 kg)    GENERAL:alert, no distress and comfortable SKIN: skin color, texture, turgor are normal, no rashes or significant lesions EYES: normal, Conjunctiva are pink and non-injected, sclera clear OROPHARYNX:no exudate, no erythema and lips, buccal mucosa, and  tongue normal  NECK: supple, thyroid normal size, non-tender, without nodularity LYMPH:  no palpable lymphadenopathy in the cervical, axillary or inguinal LUNGS: clear to auscultation and percussion with normal breathing effort HEART: regular rate & rhythm and no murmurs and no lower extremity edema ABDOMEN:abdomen soft, non-tender and normal bowel  sounds MUSCULOSKELETAL:no cyanosis of digits and no clubbing  NEURO: alert & oriented x 3 with fluent speech, no focal motor/sensory deficits EXTREMITIES: No lower extremity edema  LABORATORY DATA:  I have reviewed the data as listed CMP Latest Ref Rng & Units 01/26/2018 10/24/2012  Glucose 70 - 140 mg/dL 131 106(H)  BUN 7 - 26 mg/dL 20 17  Creatinine 0.60 - 1.10 mg/dL 0.97 0.87  Sodium 136 - 145 mmol/L 142 139  Potassium 3.5 - 5.1 mmol/L 3.5 3.6  Chloride 98 - 109 mmol/L 105 101  CO2 22 - 29 mmol/L 27 27  Calcium 8.4 - 10.4 mg/dL 9.5 9.5  Total Protein 6.4 - 8.3 g/dL 7.3 -  Total Bilirubin 0.2 - 1.2 mg/dL 0.5 -  Alkaline Phos 40 - 150 U/L 48 -  AST 5 - 34 U/L 15 -  ALT 0 - 55 U/L 14 -    Lab Results  Component Value Date   WBC 7.4 01/26/2018   HGB 13.8 01/26/2018   HCT 41.8 01/26/2018   MCV 89.4 01/26/2018   PLT 263 01/26/2018   NEUTROABS 5.6 01/26/2018    ASSESSMENT & PLAN:  Malignant neoplasm of lower-inner quadrant of left breast in female, estrogen receptor positive ([place]) 01/10/2018:Screening detected left breast mass ill-defined LIQ 3 cm at 8 o'clock position axilla negative, ultrasound biopsy: Invasive lobular cancer grade 1-2, ER 90%, PR 100%, Ki-67 10%, HER-2 negative, T2N0 stage Ib clinical stage AJCC 8  MRI Breast 02/01/18: Mass and NME extends 4.3 X 2.1 X 2.2 cm, No abnormal LN  Current Treatment: Letrozole neo-adj therapy  Letrozole Toxicities: Denies any hot flashes or myalgias. Mammogram and ultrasound at [facility] On 05/12/2018: Decrease in the size of the left breast lesion from 3 cm to 2 cm.  Return to clinic in 3 months with breast MRI and follow-up.  No orders of the defined types were placed in this encounter.  The patient has a good understanding of the overall plan. she agrees with it. she will call with any problems that may develop before the next visit here.   Harriette Ohara, MD 05/17/18

## 2018-05-17 NOTE — Assessment & Plan Note (Signed)
01/10/2018:Screening detected left breast mass ill-defined LIQ 3 cm at 8 o'clock position axilla negative, ultrasound biopsy: Invasive lobular cancer grade 1-2, ER 90%, PR 100%, Ki-67 10%, HER-2 negative, T2N0 stage Ib clinical stage AJCC 8  MRI Breast 02/01/18: Mass and NME extends 4.3 X 2.1 X 2.2 cm, No abnormal LN  Current Treatment: Letrozole neo-adj therapy  Letrozole Toxicities:

## 2018-05-17 NOTE — Telephone Encounter (Signed)
Gave patient AVs and calendar of upcoming September appointments.  °

## 2018-07-04 DIAGNOSIS — N183 Chronic kidney disease, stage 3 (moderate): Secondary | ICD-10-CM | POA: Diagnosis not present

## 2018-07-04 DIAGNOSIS — C50312 Malignant neoplasm of lower-inner quadrant of left female breast: Secondary | ICD-10-CM | POA: Diagnosis not present

## 2018-07-04 DIAGNOSIS — I129 Hypertensive chronic kidney disease with stage 1 through stage 4 chronic kidney disease, or unspecified chronic kidney disease: Secondary | ICD-10-CM | POA: Diagnosis not present

## 2018-07-04 DIAGNOSIS — E785 Hyperlipidemia, unspecified: Secondary | ICD-10-CM | POA: Diagnosis not present

## 2018-07-12 ENCOUNTER — Other Ambulatory Visit: Payer: Self-pay | Admitting: *Deleted

## 2018-07-12 DIAGNOSIS — C50312 Malignant neoplasm of lower-inner quadrant of left female breast: Secondary | ICD-10-CM

## 2018-07-12 DIAGNOSIS — Z170 Estrogen receptor positive status [ER+]: Principal | ICD-10-CM

## 2018-08-15 ENCOUNTER — Inpatient Hospital Stay: Payer: Medicare Other | Attending: Hematology and Oncology

## 2018-08-15 DIAGNOSIS — C50312 Malignant neoplasm of lower-inner quadrant of left female breast: Secondary | ICD-10-CM | POA: Diagnosis not present

## 2018-08-15 DIAGNOSIS — Z170 Estrogen receptor positive status [ER+]: Secondary | ICD-10-CM | POA: Insufficient documentation

## 2018-08-15 LAB — CBC WITH DIFFERENTIAL (CANCER CENTER ONLY)
BASOS PCT: 1 %
Basophils Absolute: 0 10*3/uL (ref 0.0–0.1)
EOS ABS: 0.1 10*3/uL (ref 0.0–0.5)
Eosinophils Relative: 2 %
HCT: 42.1 % (ref 34.8–46.6)
Hemoglobin: 14.1 g/dL (ref 11.6–15.9)
LYMPHS ABS: 1.5 10*3/uL (ref 0.9–3.3)
Lymphocytes Relative: 23 %
MCH: 29.9 pg (ref 25.1–34.0)
MCHC: 33.5 g/dL (ref 31.5–36.0)
MCV: 89.2 fL (ref 79.5–101.0)
MONO ABS: 0.5 10*3/uL (ref 0.1–0.9)
Monocytes Relative: 8 %
Neutro Abs: 4.3 10*3/uL (ref 1.5–6.5)
Neutrophils Relative %: 66 %
Platelet Count: 236 10*3/uL (ref 145–400)
RBC: 4.72 MIL/uL (ref 3.70–5.45)
RDW: 14.1 % (ref 11.2–14.5)
WBC Count: 6.4 10*3/uL (ref 3.9–10.3)

## 2018-08-15 LAB — CMP (CANCER CENTER ONLY)
ALBUMIN: 4.1 g/dL (ref 3.5–5.0)
ALK PHOS: 53 U/L (ref 38–126)
ALT: 13 U/L (ref 0–44)
AST: 14 U/L — AB (ref 15–41)
Anion gap: 10 (ref 5–15)
BUN: 19 mg/dL (ref 8–23)
CALCIUM: 9.7 mg/dL (ref 8.9–10.3)
CO2: 28 mmol/L (ref 22–32)
CREATININE: 0.95 mg/dL (ref 0.44–1.00)
Chloride: 103 mmol/L (ref 98–111)
GFR, Est AFR Am: >60 mL/min (ref >60)
GFR, Estimated: 56 mL/min — ABNORMAL LOW (ref >60)
GLUCOSE: 103 mg/dL — AB (ref 70–99)
Potassium: 3.6 mmol/L (ref 3.5–5.1)
SODIUM: 141 mmol/L (ref 135–145)
Total Bilirubin: 0.5 mg/dL (ref 0.3–1.2)
Total Protein: 7.5 g/dL (ref 6.5–8.1)

## 2018-08-16 ENCOUNTER — Ambulatory Visit (HOSPITAL_COMMUNITY): Payer: Self-pay

## 2018-08-17 ENCOUNTER — Ambulatory Visit (HOSPITAL_COMMUNITY)
Admission: RE | Admit: 2018-08-17 | Discharge: 2018-08-17 | Disposition: A | Discharge status: Home / Self Care | Payer: Medicare Other | Source: Ambulatory Visit | Attending: Hematology and Oncology | Admitting: Hematology and Oncology

## 2018-08-17 ENCOUNTER — Ambulatory Visit (HOSPITAL_COMMUNITY): Payer: Self-pay

## 2018-08-17 DIAGNOSIS — Z5111 Encounter for antineoplastic chemotherapy: Secondary | ICD-10-CM | POA: Diagnosis not present

## 2018-08-17 DIAGNOSIS — Z170 Estrogen receptor positive status [ER+]: Secondary | ICD-10-CM | POA: Diagnosis present

## 2018-08-17 DIAGNOSIS — C50312 Malignant neoplasm of lower-inner quadrant of left female breast: Secondary | ICD-10-CM | POA: Diagnosis not present

## 2018-08-17 DIAGNOSIS — N6321 Unspecified lump in the left breast, upper outer quadrant: Secondary | ICD-10-CM | POA: Diagnosis not present

## 2018-08-17 MED ORDER — GADOBENATE DIMEGLUMINE 529 MG/ML IV SOLN
15.0000 mL | Freq: Once | INTRAVENOUS | Status: AC | PRN
  Administered 2018-08-17: 13 mL via INTRAVENOUS

## 2018-08-23 ENCOUNTER — Inpatient Hospital Stay: Payer: Medicare Other | Attending: Hematology and Oncology | Admitting: Hematology and Oncology

## 2018-08-23 DIAGNOSIS — Z170 Estrogen receptor positive status [ER+]: Secondary | ICD-10-CM | POA: Diagnosis not present

## 2018-08-23 DIAGNOSIS — C50312 Malignant neoplasm of lower-inner quadrant of left female breast: Secondary | ICD-10-CM | POA: Insufficient documentation

## 2018-08-23 DIAGNOSIS — Z79811 Long term (current) use of aromatase inhibitors: Secondary | ICD-10-CM | POA: Insufficient documentation

## 2018-08-23 NOTE — Progress Notes (Signed)
Patient Care Team: Harlan Stains, MD as PCP - General (Family Medicine)  DIAGNOSIS:  Encounter Diagnosis  Name Primary?  . Malignant neoplasm of lower-inner quadrant of left breast in female, estrogen receptor positive ([place])     SUMMARY OF ONCOLOGIC HISTORY:   Malignant neoplasm of lower-inner quadrant of left breast in female, estrogen receptor positive ([place])   01/10/2018 Initial Diagnosis    Screening detected left breast mass ill-defined LIQ 3 cm at 8 o'clock position axilla negative, ultrasound biopsy: Invasive lobular cancer grade 1-2, ER 90%, PR 100%, Ki-67 10%, HER-2 negative, T2N0 stage Ib clinical stage AJCC 8    02/08/2018 -  Anti-estrogen oral therapy    Letrozole Neoadj therapy     CHIEF COMPLIANT: Follow-up on letrozole therapy  INTERVAL HISTORY: Norma Simon is a 77 year old with above-mentioned history of left breast cancer is currently on neoadjuvant letrozole.  She underwent a breast MRI and is here today for follow-up.  She reports no new problems or concerns.  REVIEW OF SYSTEMS:   Constitutional: Denies fevers, chills or abnormal weight loss Eyes: Denies blurriness of vision Ears, nose, mouth, throat, and face: Denies mucositis or sore throat Respiratory: Denies cough, dyspnea or wheezes Cardiovascular: Denies palpitation, chest discomfort Gastrointestinal:  Denies nausea, heartburn or change in bowel habits Skin: Denies abnormal skin rashes Lymphatics: Denies new lymphadenopathy or easy bruising Neurological:Denies numbness, tingling or new weaknesses Behavioral/Psych: Mood is stable, no new changes  Extremities: No lower extremity edema Breast:  denies any pain or lumps or nodules in either breasts All other systems were reviewed with the patient and are negative.  I have reviewed the past medical history, past surgical history, social history and family history with the patient and they are unchanged from previous note.  ALLERGIES:  has No Known  Allergies.  MEDICATIONS:  Current Outpatient Medications  Medication Sig Dispense Refill  . amLODipine (NORVASC) 5 MG tablet Take 2.5 mg by mouth daily.     Marland Kitchen aspirin EC 81 MG tablet Take 81 mg by mouth daily.    . benazepril-hydrochlorthiazide (LOTENSIN HCT) 20-25 MG per tablet Take 1 tablet by mouth daily.    . calcium carbonate (OS-CAL) 600 MG TABS Take 600 mg by mouth daily.    . cholecalciferol (VITAMIN D) 1000 UNITS tablet Take 1,000 Units by mouth daily.    Marland Kitchen letrozole (FEMARA) 2.5 MG tablet Take 1 tablet (2.5 mg total) by mouth daily. 90 tablet 3  . Multiple Vitamin (MULTIVITAMIN WITH MINERALS) TABS Take 1 tablet by mouth daily.    . pravastatin (PRAVACHOL) 40 MG tablet Take 40 mg by mouth every evening.    . vitamin E 400 UNIT capsule Take 400 Units by mouth daily.     No current facility-administered medications for this visit.     PHYSICAL EXAMINATION: ECOG PERFORMANCE STATUS: 1 - Symptomatic but completely ambulatory  Vitals:   08/23/18 1453  BP: 139/66  Pulse: 67  Resp: 18  Temp: 98.4 F (36.9 C)  SpO2: 98%   Filed Weights   08/23/18 1453  Weight: 141 lb 3.2 oz (64 kg)    GENERAL:alert, no distress and comfortable SKIN: skin color, texture, turgor are normal, no rashes or significant lesions EYES: normal, Conjunctiva are pink and non-injected, sclera clear OROPHARYNX:no exudate, no erythema and lips, buccal mucosa, and tongue normal  NECK: supple, thyroid normal size, non-tender, without nodularity LYMPH:  no palpable lymphadenopathy in the cervical, axillary or inguinal LUNGS: clear to auscultation and percussion with normal breathing  effort HEART: regular rate & rhythm and no murmurs and no lower extremity edema ABDOMEN:abdomen soft, non-tender and normal bowel sounds MUSCULOSKELETAL:no cyanosis of digits and no clubbing  NEURO: alert & oriented x 3 with fluent speech, no focal motor/sensory deficits EXTREMITIES: No lower extremity edema   LABORATORY  DATA:  I have reviewed the data as listed CMP Latest Ref Rng & Units 08/15/2018 01/26/2018 10/24/2012  Glucose 70 - 99 mg/dL 103(H) 131 106(H)  BUN 8 - 23 mg/dL _0 Creatinine 0.44 - 1.00 mg/dL 0.95 0.97 0.87  Sodium 135 - 145 mmol/L 141 142 139  Potassium 3.5 - 5.1 mmol/L 3.6 3.5 3.6  Chloride 98 - 111 mmol/L 103 105 101  CO2 22 - 32 mmol/L _1 Calcium 8.9 - 10.3 mg/dL 9.7 9.5 9.5  Total Protein 6.5 - 8.1 g/dL 7.5 7.3 -  Total Bilirubin 0.3 - 1.2 mg/dL 0.5 0.5 -  Alkaline Phos 38 - 126 U/L 53 48 -  AST 15 - 41 U/L 14(L) 15 -  ALT 0 - 44 U/L 13 14 -    Lab Results  Component Value Date   WBC 6.4 08/15/2018   HGB 14.1 08/15/2018   HCT 42.1 08/15/2018   MCV 89.2 08/15/2018   PLT 236 08/15/2018   NEUTROABS 4.3 08/15/2018    ASSESSMENT & PLAN:  Malignant neoplasm of lower-inner quadrant of left breast in female, estrogen receptor positive ([place]) 01/10/2018:Screening detected left breast mass ill-defined LIQ 3 cm at 8 o'clock position axilla negative, ultrasound biopsy: Invasive lobular cancer grade 1-2, ER 90%, PR 100%, Ki-67 10%, HER-2 negative, T2N0 stage Ib clinical stage AJCC 8  MRI Breast 02/01/18: Mass and NME extends 4.3 X 2.1 X 2.2 cm, No abnormal LN MRI breast 08/09/2018: Near complete resolution of the clumped non-mass enhancement within the lower inner quadrant left breast, cardiomegaly  Current Treatment: Letrozole neo-adj therapy  Letrozole Toxicities: Denies any hot flashes or myalgias. Mammogram and ultrasound at [facility] On 05/12/2018: Decrease in the size of the left breast lesion from 3 cm to 2 cm.  I reviewed the breast MRI report and provided her with a copy of this report. I recommended surgical evaluation for breast conserving surgery.  Return to clinic after surgery to discuss pathology report. I sent a message to Dr. Donne Hazel to get her into be seen to discuss surgery   No orders of the defined types were placed in this encounter.  The  patient has a good understanding of the overall plan. she agrees with it. she will call with any problems that may develop before the next visit here.   Harriette Ohara, MD 08/23/18

## 2018-08-23 NOTE — Assessment & Plan Note (Signed)
01/10/2018:Screening detected left breast mass ill-defined LIQ 3 cm at 8 o'clock position axilla negative, ultrasound biopsy: Invasive lobular cancer grade 1-2, ER 90%, PR 100%, Ki-67 10%, HER-2 negative, T2N0 stage Ib clinical stage AJCC 8  MRI Breast 02/01/18: Mass and NME extends 4.3 X 2.1 X 2.2 cm, No abnormal LN MRI breast 08/09/2018: Near complete resolution of the clumped non-mass enhancement within the lower inner quadrant left breast, cardiomegaly  Current Treatment: Letrozole neo-adj therapy  Letrozole Toxicities: Denies any hot flashes or myalgias. Mammogram and ultrasound at [facility] On 05/12/2018: Decrease in the size of the left breast lesion from 3 cm to 2 cm.  I reviewed the breast MRI report and provided her with a copy of this report. I recommended surgical evaluation for breast conserving surgery.  Return to clinic after surgery to discuss pathology report.

## 2018-08-31 ENCOUNTER — Other Ambulatory Visit: Payer: Self-pay | Admitting: General Surgery

## 2018-08-31 DIAGNOSIS — C50312 Malignant neoplasm of lower-inner quadrant of left female breast: Secondary | ICD-10-CM

## 2018-08-31 DIAGNOSIS — Z170 Estrogen receptor positive status [ER+]: Principal | ICD-10-CM

## 2018-09-05 ENCOUNTER — Encounter (HOSPITAL_BASED_OUTPATIENT_CLINIC_OR_DEPARTMENT_OTHER): Payer: Self-pay | Admitting: *Deleted

## 2018-09-05 ENCOUNTER — Telehealth: Payer: Self-pay | Admitting: Hematology and Oncology

## 2018-09-05 NOTE — Telephone Encounter (Signed)
Spoke to pt regarding upcoming appts per 9/12 sch message   °

## 2018-09-06 ENCOUNTER — Encounter (HOSPITAL_BASED_OUTPATIENT_CLINIC_OR_DEPARTMENT_OTHER): Payer: Self-pay | Admitting: *Deleted

## 2018-09-06 ENCOUNTER — Other Ambulatory Visit: Payer: Self-pay

## 2018-09-07 ENCOUNTER — Encounter (HOSPITAL_BASED_OUTPATIENT_CLINIC_OR_DEPARTMENT_OTHER)
Admission: RE | Admit: 2018-09-07 | Discharge: 2018-09-07 | Disposition: A | Discharge status: Home / Self Care | Payer: Medicare Other | Source: Ambulatory Visit | Attending: General Surgery | Admitting: General Surgery

## 2018-09-07 DIAGNOSIS — Z01810 Encounter for preprocedural cardiovascular examination: Secondary | ICD-10-CM | POA: Insufficient documentation

## 2018-09-07 DIAGNOSIS — C50312 Malignant neoplasm of lower-inner quadrant of left female breast: Secondary | ICD-10-CM | POA: Diagnosis not present

## 2018-09-07 DIAGNOSIS — Z803 Family history of malignant neoplasm of breast: Secondary | ICD-10-CM | POA: Diagnosis not present

## 2018-09-07 DIAGNOSIS — I10 Essential (primary) hypertension: Secondary | ICD-10-CM | POA: Insufficient documentation

## 2018-09-07 DIAGNOSIS — Z801 Family history of malignant neoplasm of trachea, bronchus and lung: Secondary | ICD-10-CM | POA: Diagnosis not present

## 2018-09-07 DIAGNOSIS — Z79810 Long term (current) use of selective estrogen receptor modulators (SERMs): Secondary | ICD-10-CM | POA: Diagnosis not present

## 2018-09-07 DIAGNOSIS — E785 Hyperlipidemia, unspecified: Secondary | ICD-10-CM | POA: Diagnosis not present

## 2018-09-07 DIAGNOSIS — Z170 Estrogen receptor positive status [ER+]: Secondary | ICD-10-CM | POA: Diagnosis not present

## 2018-09-07 NOTE — Progress Notes (Signed)
EKG reviewed by Dr. Lissa Hoard, will proceed with surgery as scheduled.  Ensure pre surgery drink given with instructions to complete by [place], surgical soap given with instructions.

## 2018-09-12 DIAGNOSIS — C50312 Malignant neoplasm of lower-inner quadrant of left female breast: Secondary | ICD-10-CM | POA: Diagnosis not present

## 2018-09-13 ENCOUNTER — Other Ambulatory Visit: Payer: Self-pay

## 2018-09-13 ENCOUNTER — Ambulatory Visit (HOSPITAL_BASED_OUTPATIENT_CLINIC_OR_DEPARTMENT_OTHER)
Admission: RE | Admit: 2018-09-13 | Discharge: 2018-09-13 | Disposition: A | Discharge status: Home / Self Care | Payer: Medicare Other | Source: Ambulatory Visit | Attending: General Surgery | Admitting: General Surgery

## 2018-09-13 ENCOUNTER — Encounter (HOSPITAL_BASED_OUTPATIENT_CLINIC_OR_DEPARTMENT_OTHER): Payer: Self-pay

## 2018-09-13 ENCOUNTER — Encounter (HOSPITAL_BASED_OUTPATIENT_CLINIC_OR_DEPARTMENT_OTHER)
Admission: RE | Disposition: A | Discharge status: Home / Self Care | Payer: Self-pay | Source: Ambulatory Visit | Attending: General Surgery

## 2018-09-13 ENCOUNTER — Ambulatory Visit (HOSPITAL_BASED_OUTPATIENT_CLINIC_OR_DEPARTMENT_OTHER): Payer: Medicare Other | Admitting: Anesthesiology

## 2018-09-13 ENCOUNTER — Encounter (HOSPITAL_COMMUNITY)
Admission: RE | Admit: 2018-09-13 | Discharge: 2018-09-13 | Disposition: A | Discharge status: Home / Self Care | Payer: Medicare Other | Source: Ambulatory Visit | Attending: General Surgery | Admitting: General Surgery

## 2018-09-13 DIAGNOSIS — E785 Hyperlipidemia, unspecified: Secondary | ICD-10-CM | POA: Insufficient documentation

## 2018-09-13 DIAGNOSIS — Z803 Family history of malignant neoplasm of breast: Secondary | ICD-10-CM | POA: Diagnosis not present

## 2018-09-13 DIAGNOSIS — C50912 Malignant neoplasm of unspecified site of left female breast: Secondary | ICD-10-CM | POA: Diagnosis not present

## 2018-09-13 DIAGNOSIS — Z801 Family history of malignant neoplasm of trachea, bronchus and lung: Secondary | ICD-10-CM | POA: Diagnosis not present

## 2018-09-13 DIAGNOSIS — C50312 Malignant neoplasm of lower-inner quadrant of left female breast: Secondary | ICD-10-CM

## 2018-09-13 DIAGNOSIS — Z79810 Long term (current) use of selective estrogen receptor modulators (SERMs): Secondary | ICD-10-CM | POA: Diagnosis not present

## 2018-09-13 DIAGNOSIS — Z170 Estrogen receptor positive status [ER+]: Secondary | ICD-10-CM | POA: Diagnosis not present

## 2018-09-13 DIAGNOSIS — I10 Essential (primary) hypertension: Secondary | ICD-10-CM | POA: Insufficient documentation

## 2018-09-13 DIAGNOSIS — G8918 Other acute postprocedural pain: Secondary | ICD-10-CM | POA: Diagnosis not present

## 2018-09-13 DIAGNOSIS — N62 Hypertrophy of breast: Secondary | ICD-10-CM | POA: Diagnosis not present

## 2018-09-13 HISTORY — PX: BREAST LUMPECTOMY WITH RADIOACTIVE SEED AND SENTINEL LYMPH NODE BIOPSY: SHX6550

## 2018-09-13 HISTORY — DX: Other specified postprocedural states: Z98.890

## 2018-09-13 HISTORY — DX: Adverse effect of unspecified anesthetic, initial encounter: T41.45XA

## 2018-09-13 HISTORY — DX: Other complications of anesthesia, initial encounter: T88.59XA

## 2018-09-13 HISTORY — DX: Nausea with vomiting, unspecified: R11.2

## 2018-09-13 HISTORY — DX: Malignant (primary) neoplasm, unspecified: C80.1

## 2018-09-13 SURGERY — BREAST LUMPECTOMY WITH RADIOACTIVE SEED AND SENTINEL LYMPH NODE BIOPSY
Anesthesia: General | Site: Breast | Laterality: Left

## 2018-09-13 MED ORDER — MIDAZOLAM HCL 2 MG/2ML IJ SOLN
1.0000 mg | INTRAMUSCULAR | Status: DC | PRN
  Administered 2018-09-13: 1 mg via INTRAVENOUS

## 2018-09-13 MED ORDER — LIDOCAINE 2% (20 MG/ML) 5 ML SYRINGE
INTRAMUSCULAR | Status: AC
  Filled 2018-09-13: qty 5

## 2018-09-13 MED ORDER — OXYCODONE HCL 5 MG PO TABS: 5.0000 mg | ORAL_TABLET | Freq: Four times a day (QID) | ORAL | 0 refills | Status: DC | PRN

## 2018-09-13 MED ORDER — ROPIVACAINE HCL 5 MG/ML IJ SOLN
INTRAMUSCULAR | Status: DC | PRN
  Administered 2018-09-13: 30 mL via PERINEURAL

## 2018-09-13 MED ORDER — DEXAMETHASONE SODIUM PHOSPHATE 10 MG/ML IJ SOLN
INTRAMUSCULAR | Status: DC | PRN
  Administered 2018-09-13: 10 mg via INTRAVENOUS

## 2018-09-13 MED ORDER — FENTANYL CITRATE (PF) 100 MCG/2ML IJ SOLN
INTRAMUSCULAR | Status: AC
  Filled 2018-09-13: qty 2

## 2018-09-13 MED ORDER — FENTANYL CITRATE (PF) 100 MCG/2ML IJ SOLN: 25.0000 ug | INTRAMUSCULAR | Status: DC | PRN

## 2018-09-13 MED ORDER — FENTANYL CITRATE (PF) 100 MCG/2ML IJ SOLN
50.0000 ug | INTRAMUSCULAR | Status: DC | PRN
  Administered 2018-09-13: 50 ug via INTRAVENOUS

## 2018-09-13 MED ORDER — LACTATED RINGERS IV SOLN
INTRAVENOUS | Status: DC
  Administered 2018-09-13: 13:00:00 via INTRAVENOUS

## 2018-09-13 MED ORDER — ACETAMINOPHEN 500 MG PO TABS
1000.0000 mg | ORAL_TABLET | ORAL | Status: AC
  Administered 2018-09-13: 1000 mg via ORAL

## 2018-09-13 MED ORDER — DEXAMETHASONE SODIUM PHOSPHATE 10 MG/ML IJ SOLN
INTRAMUSCULAR | Status: AC
  Filled 2018-09-13: qty 1

## 2018-09-13 MED ORDER — PROPOFOL 10 MG/ML IV BOLUS
INTRAVENOUS | Status: DC | PRN
  Administered 2018-09-13: 110 mg via INTRAVENOUS

## 2018-09-13 MED ORDER — CEFAZOLIN SODIUM-DEXTROSE 2-4 GM/100ML-% IV SOLN
2.0000 g | INTRAVENOUS | Status: AC
  Administered 2018-09-13: 2 g via INTRAVENOUS

## 2018-09-13 MED ORDER — PHENYLEPHRINE 40 MCG/ML (10ML) SYRINGE FOR IV PUSH (FOR BLOOD PRESSURE SUPPORT)
PREFILLED_SYRINGE | INTRAVENOUS | Status: DC | PRN
  Administered 2018-09-13 (×2): 80 ug via INTRAVENOUS

## 2018-09-13 MED ORDER — ONDANSETRON HCL 4 MG/2ML IJ SOLN
INTRAMUSCULAR | Status: AC
  Filled 2018-09-13: qty 2

## 2018-09-13 MED ORDER — TECHNETIUM TC 99M SULFUR COLLOID FILTERED: 1.0000 | Freq: Once | INTRAVENOUS | Status: DC | PRN

## 2018-09-13 MED ORDER — PHENYLEPHRINE 40 MCG/ML (10ML) SYRINGE FOR IV PUSH (FOR BLOOD PRESSURE SUPPORT)
PREFILLED_SYRINGE | INTRAVENOUS | Status: AC
  Filled 2018-09-13: qty 10

## 2018-09-13 MED ORDER — ACETAMINOPHEN 500 MG PO TABS
ORAL_TABLET | ORAL | Status: AC
  Filled 2018-09-13: qty 2

## 2018-09-13 MED ORDER — MIDAZOLAM HCL 2 MG/2ML IJ SOLN
INTRAMUSCULAR | Status: AC
  Filled 2018-09-13: qty 2

## 2018-09-13 MED ORDER — LIDOCAINE HCL (CARDIAC) PF 100 MG/5ML IV SOSY
PREFILLED_SYRINGE | INTRAVENOUS | Status: DC | PRN
  Administered 2018-09-13: 80 mg via INTRAVENOUS

## 2018-09-13 MED ORDER — CEFAZOLIN SODIUM-DEXTROSE 2-4 GM/100ML-% IV SOLN
INTRAVENOUS | Status: AC
  Filled 2018-09-13: qty 100

## 2018-09-13 MED ORDER — SCOPOLAMINE 1 MG/3DAYS TD PT72
1.0000 | MEDICATED_PATCH | Freq: Once | TRANSDERMAL | Status: DC | PRN
  Administered 2018-09-13: 1.5 mg via TRANSDERMAL

## 2018-09-13 MED ORDER — SCOPOLAMINE 1 MG/3DAYS TD PT72: 1.0000 | MEDICATED_PATCH | TRANSDERMAL | Status: DC

## 2018-09-13 MED ORDER — SCOPOLAMINE 1 MG/3DAYS TD PT72
MEDICATED_PATCH | TRANSDERMAL | Status: AC
  Filled 2018-09-13: qty 1

## 2018-09-13 MED ORDER — EPHEDRINE 5 MG/ML INJ
INTRAVENOUS | Status: AC
  Filled 2018-09-13: qty 10

## 2018-09-13 MED ORDER — GABAPENTIN 100 MG PO CAPS
100.0000 mg | ORAL_CAPSULE | ORAL | Status: AC
  Administered 2018-09-13: 100 mg via ORAL

## 2018-09-13 MED ORDER — ONDANSETRON HCL 4 MG/2ML IJ SOLN
INTRAMUSCULAR | Status: DC | PRN
  Administered 2018-09-13: 4 mg via INTRAVENOUS

## 2018-09-13 MED ORDER — PROPOFOL 10 MG/ML IV BOLUS
INTRAVENOUS | Status: AC
  Filled 2018-09-13: qty 20

## 2018-09-13 MED ORDER — BUPIVACAINE HCL (PF) 0.25 % IJ SOLN
INTRAMUSCULAR | Status: DC | PRN
  Administered 2018-09-13: 7 mL

## 2018-09-13 MED ORDER — EPHEDRINE SULFATE-NACL 50-0.9 MG/10ML-% IV SOSY
PREFILLED_SYRINGE | INTRAVENOUS | Status: DC | PRN
  Administered 2018-09-13 (×4): 5 mg via INTRAVENOUS

## 2018-09-13 MED ORDER — GABAPENTIN 100 MG PO CAPS
ORAL_CAPSULE | ORAL | Status: AC
  Filled 2018-09-13: qty 1

## 2018-09-13 SURGICAL SUPPLY — 60 items
ADH SKN CLS APL DERMABOND .7 (GAUZE/BANDAGES/DRESSINGS) ×1
APPLIER CLIP 9.375 MED OPEN (MISCELLANEOUS) ×2
APR CLP MED 9.3 20 MLT OPN (MISCELLANEOUS) ×1
BINDER BREAST LRG (GAUZE/BANDAGES/DRESSINGS) ×1 IMPLANT
BINDER BREAST MEDIUM (GAUZE/BANDAGES/DRESSINGS) IMPLANT
BINDER BREAST XLRG (GAUZE/BANDAGES/DRESSINGS) IMPLANT
BINDER BREAST XXLRG (GAUZE/BANDAGES/DRESSINGS) IMPLANT
BLADE SURG 15 STRL LF DISP TIS (BLADE) ×1 IMPLANT
BLADE SURG 15 STRL SS (BLADE) ×2
CANISTER SUC SOCK COL 7IN (MISCELLANEOUS) IMPLANT
CANISTER SUCT 1200ML W/VALVE (MISCELLANEOUS) IMPLANT
CHLORAPREP W/TINT 26ML (MISCELLANEOUS) ×2 IMPLANT
CLIP APPLIE 9.375 MED OPEN (MISCELLANEOUS) IMPLANT
CLIP VESOCCLUDE SM WIDE 6/CT (CLIP) ×2 IMPLANT
COVER BACK TABLE 60X90IN (DRAPES) ×2 IMPLANT
COVER MAYO STAND STRL (DRAPES) ×2 IMPLANT
COVER PROBE W GEL 5X96 (DRAPES) ×2 IMPLANT
DECANTER SPIKE VIAL GLASS SM (MISCELLANEOUS) IMPLANT
DERMABOND ADVANCED (GAUZE/BANDAGES/DRESSINGS) ×1
DERMABOND ADVANCED .7 DNX12 (GAUZE/BANDAGES/DRESSINGS) ×1 IMPLANT
DEVICE DUBIN W/COMP PLATE 8390 (MISCELLANEOUS) ×2 IMPLANT
DRAPE LAPAROSCOPIC ABDOMINAL (DRAPES) ×2 IMPLANT
DRAPE UTILITY XL STRL (DRAPES) ×2 IMPLANT
ELECT COATED BLADE 2.86 ST (ELECTRODE) ×2 IMPLANT
ELECT REM PT RETURN 9FT ADLT (ELECTROSURGICAL) ×2
ELECTRODE REM PT RTRN 9FT ADLT (ELECTROSURGICAL) ×1 IMPLANT
GLOVE BIO SURGEON STRL SZ 6.5 (GLOVE) ×1 IMPLANT
GLOVE BIO SURGEON STRL SZ7 (GLOVE) ×4 IMPLANT
GLOVE BIOGEL PI IND STRL 7.5 (GLOVE) ×1 IMPLANT
GLOVE BIOGEL PI INDICATOR 7.5 (GLOVE) ×1
GOWN STRL REUS W/ TWL LRG LVL3 (GOWN DISPOSABLE) ×2 IMPLANT
GOWN STRL REUS W/TWL LRG LVL3 (GOWN DISPOSABLE) ×4
HEMOSTAT ARISTA ABSORB 3G PWDR (MISCELLANEOUS) ×1 IMPLANT
ILLUMINATOR WAVEGUIDE N/F (MISCELLANEOUS) ×1 IMPLANT
KIT MARKER MARGIN INK (KITS) ×2 IMPLANT
LIGHT WAVEGUIDE WIDE FLAT (MISCELLANEOUS) IMPLANT
NDL HYPO 25X1 1.5 SAFETY (NEEDLE) ×1 IMPLANT
NDL SAFETY ECLIPSE 18X1.5 (NEEDLE) IMPLANT
NEEDLE HYPO 18GX1.5 SHARP (NEEDLE)
NEEDLE HYPO 25X1 1.5 SAFETY (NEEDLE) ×2 IMPLANT
NS IRRIG 1000ML POUR BTL (IV SOLUTION) IMPLANT
PACK BASIN DAY SURGERY FS (CUSTOM PROCEDURE TRAY) ×2 IMPLANT
PENCIL BUTTON HOLSTER BLD 10FT (ELECTRODE) ×2 IMPLANT
SLEEVE SCD COMPRESS KNEE MED (MISCELLANEOUS) ×2 IMPLANT
SPONGE LAP 4X18 RFD (DISPOSABLE) ×2 IMPLANT
STRIP CLOSURE SKIN 1/2X4 (GAUZE/BANDAGES/DRESSINGS) ×2 IMPLANT
SUT ETHILON 2 0 FS 18 (SUTURE) IMPLANT
SUT MNCRL AB 4-0 PS2 18 (SUTURE) ×2 IMPLANT
SUT MON AB 5-0 PS2 18 (SUTURE) IMPLANT
SUT SILK 2 0 SH (SUTURE) IMPLANT
SUT VIC AB 2-0 SH 27 (SUTURE) ×2
SUT VIC AB 2-0 SH 27XBRD (SUTURE) ×1 IMPLANT
SUT VIC AB 3-0 SH 27 (SUTURE) ×2
SUT VIC AB 3-0 SH 27X BRD (SUTURE) ×1 IMPLANT
SUT VIC AB 5-0 PS2 18 (SUTURE) IMPLANT
SYR CONTROL 10ML LL (SYRINGE) ×2 IMPLANT
TOWEL GREEN STERILE FF (TOWEL DISPOSABLE) ×2 IMPLANT
TOWEL OR NON WOVEN STRL DISP B (DISPOSABLE) ×1 IMPLANT
TUBE CONNECTING 20X1/4 (TUBING) IMPLANT
YANKAUER SUCT BULB TIP NO VENT (SUCTIONS) IMPLANT

## 2018-09-13 NOTE — Discharge Instructions (Signed)
[facility] °Office Phone Number [phone] ° POST OP INSTRUCTIONS °Take 400 mg of ibuprofen every 8 hours or 650 mg tylenol every 6 hours for next 72 hours then as needed. Use ice several times daily also. °Always review your discharge instruction sheet given to you by the facility where your surgery was performed. ° °IF YOU HAVE DISABILITY OR FAMILY LEAVE FORMS, YOU MUST BRING THEM TO THE OFFICE FOR PROCESSING.  DO NOT GIVE THEM TO YOUR DOCTOR. ° °1. A prescription for pain medication may be given to you upon discharge.  Take your pain medication as prescribed, if needed.  If narcotic pain medicine is not needed, then you may take acetaminophen (Tylenol), naprosyn (Alleve) or ibuprofen (Advil) as needed. °2. Take your usually prescribed medications unless otherwise directed °3. If you need a refill on your pain medication, please contact your pharmacy.  They will contact our office to request authorization.  Prescriptions will not be filled after 5pm or on week-ends. °4. You should eat very light the first 24 hours after surgery, such as soup, crackers, pudding, etc.  Resume your normal diet the day after surgery. °5. Most patients will experience some swelling and bruising in the breast.  Ice packs and a good support bra will help.  Wear the breast binder provided or a sports bra for 72 hours day and night.  After that wear a sports bra during the day until you return to the office. Swelling and bruising can take several days to resolve.  °6. It is common to experience some constipation if taking pain medication after surgery.  Increasing fluid intake and taking a stool softener will usually help or prevent this problem from occurring.  A mild laxative (Milk of Magnesia or Miralax) should be taken according to package directions if there are no bowel movements after 48 hours. °7. Unless discharge instructions indicate otherwise, you may remove your bandages 48 hours after surgery and you may  shower at that time.  You may have steri-strips (small skin tapes) in place directly over the incision.  These strips should be left on the skin for 7-10 days and will come off on their own.  If your surgeon used skin glue on the incision, you may shower in 24 hours.  The glue will flake off over the next 2-3 weeks.  Any sutures or staples will be removed at the office during your follow-up visit. °8. ACTIVITIES:  You may resume regular daily activities (gradually increasing) beginning the next day.  Wearing a good support bra or sports bra minimizes pain and swelling.  You may have sexual intercourse when it is comfortable. °a. You may drive when you no longer are taking prescription pain medication, you can comfortably wear a seatbelt, and you can safely maneuver your car and apply brakes. °b. RETURN TO WORK:  ______________________________________________________________________________________ °9. You should see your doctor in the office for a follow-up appointment approximately two weeks after your surgery.  Your doctor’s nurse will typically make your follow-up appointment when she calls you with your pathology report.  Expect your pathology report 3-4 business days after your surgery.  You may call to check if you do not hear from us after three days. °10. OTHER INSTRUCTIONS: _______________________________________________________________________________________________ _____________________________________________________________________________________________________________________________________ °_____________________________________________________________________________________________________________________________________ °_____________________________________________________________________________________________________________________________________ ° °WHEN TO CALL DR WAKEFIELD: °1. Fever over 101.0 °2. Nausea and/or vomiting. °3. Extreme swelling or bruising. °4. Continued bleeding from  incision. °5. Increased pain, redness, or drainage from the incision. ° °The clinic staff is available to   answer your questions during regular business hours.  Please don’t hesitate to call and ask to speak to one of the nurses for clinical concerns.  If you have a medical emergency, go to the nearest emergency room or call 911.  A surgeon from [facility] is always on call at the hospital. ° °For further questions, please visit [email or web address] ° ° °Post Anesthesia Home Care Instructions ° °Activity: °Get plenty of rest for the remainder of the day. A responsible individual must stay with you for 24 hours following the procedure.  °For the next 24 hours, DO NOT: °-Drive a car °-Operate machinery °-Drink alcoholic beverages °-Take any medication unless instructed by your physician °-Make any legal decisions or sign important papers. ° °Meals: °Start with liquid foods such as gelatin or soup. Progress to regular foods as tolerated. Avoid greasy, spicy, heavy foods. If nausea and/or vomiting occur, drink only clear liquids until the nausea and/or vomiting subsides. Call your physician if vomiting continues. ° °Special Instructions/Symptoms: °Your throat may feel dry or sore from the anesthesia or the breathing tube placed in your throat during surgery. If this causes discomfort, gargle with warm salt water. The discomfort should disappear within 24 hours. ° °If you had a scopolamine patch placed behind your ear for the management of post- operative nausea and/or vomiting: ° °1. The medication in the patch is effective for 72 hours, after which it should be removed.  Wrap patch in a tissue and discard in the trash. Wash hands thoroughly with soap and water. °2. You may remove the patch earlier than 72 hours if you experience unpleasant side effects which may include dry mouth, dizziness or visual disturbances. °3. Avoid touching the patch. Wash your hands with soap and water after contact  with the patch. °   °Post Anesthesia Home Care Instructions ° °Activity: °Get plenty of rest for the remainder of the day. A responsible individual must stay with you for 24 hours following the procedure.  °For the next 24 hours, DO NOT: °-Drive a car °-Operate machinery °-Drink alcoholic beverages °-Take any medication unless instructed by your physician °-Make any legal decisions or sign important papers. ° °Meals: °Start with liquid foods such as gelatin or soup. Progress to regular foods as tolerated. Avoid greasy, spicy, heavy foods. If nausea and/or vomiting occur, drink only clear liquids until the nausea and/or vomiting subsides. Call your physician if vomiting continues. ° °Special Instructions/Symptoms: °Your throat may feel dry or sore from the anesthesia or the breathing tube placed in your throat during surgery. If this causes discomfort, gargle with warm salt water. The discomfort should disappear within 24 hours. ° °If you had a scopolamine patch placed behind your ear for the management of post- operative nausea and/or vomiting: ° °1. The medication in the patch is effective for 72 hours, after which it should be removed.  Wrap patch in a tissue and discard in the trash. Wash hands thoroughly with soap and water. °2. You may remove the patch earlier than 72 hours if you experience unpleasant side effects which may include dry mouth, dizziness or visual disturbances. °3. Avoid touching the patch. Wash your hands with soap and water after contact with the patch. °   ° ° °

## 2018-09-13 NOTE — Anesthesia Postprocedure Evaluation (Signed)
Anesthesia Post Note  Patient: Analise Glotfelty  Procedure(s) Performed: BREAST LUMPECTOMY WITH RADIOACTIVE SEED AND SENTINEL LYMPH NODE BIOPSY (Left Breast)     Patient location during evaluation: PACU Anesthesia Type: General Level of consciousness: awake and alert Pain management: pain level controlled Vital Signs Assessment: post-procedure vital signs reviewed and stable Respiratory status: spontaneous breathing, nonlabored ventilation, respiratory function stable and patient connected to nasal cannula oxygen Cardiovascular status: blood pressure returned to baseline and stable Postop Assessment: no apparent nausea or vomiting Anesthetic complications: no    Last Vitals:  Vitals:   09/13/18 1530 09/13/18 1545  BP: 130/78 125/67  Pulse: 94 (!) 56  Resp: 20 16  Temp:    SpO2: 100% 100%    Last Pain:  Vitals:   09/13/18 1527  TempSrc:   PainSc: 0-No pain                 Jesyca Weisenburger L Frazier Balfour

## 2018-09-13 NOTE — Anesthesia Procedure Notes (Addendum)
Anesthesia Regional Block: Pectoralis block   Pre-Anesthetic Checklist: ,, timeout performed, Correct Patient, Correct Site, Correct Laterality, Correct Procedure, Correct Position, site marked, Risks and benefits discussed,  Surgical consent,  Pre-op evaluation,  At surgeon's request and post-op pain management  Laterality: Left  Prep: Maximum Sterile Barrier Precautions used, chloraprep       Needles:  Injection technique: Single-shot  Needle Type: Echogenic Stimulator Needle     Needle Length: 5cm  Needle Gauge: 22     Additional Needles:   Procedures:,,,, ultrasound used (permanent image in chart),,,,  Narrative:  Start time: 09/13/2018 1:51 PM End time: 09/13/2018 2:01 PM Injection made incrementally with aspirations every 5 mL.  Performed by: Personally  Anesthesiologist: Freddrick March, MD  Additional Notes: Monitors applied. No increased pain on injection. No increased resistance to injection. Injection made in 5cc increments. Good needle visualization. Patient tolerated procedure well.

## 2018-09-13 NOTE — Op Note (Signed)
Preoperative diagnosis: Left breast cancer status post primary endocrine therapy Postoperative diagnosis: Same as above Procedure: 1.  Left breast radioactive seed guided lumpectomy 2.  Left deep axillary sentinel lymph node biopsy Surgeon: Dr. Serita Grammes Anesthesia: General with a pectoral block Specimens: 1.  Left breast tissue marked with paint 2.  Left breast medial margin marked short stitch superior, long stitch lateral, double stitch deep 3.  Left breast inferior margin marked short stitch superior, long stitch lateral, double stitch deep 4.  Left breast posterior margin marked short stitch superior, long stitch lateral, double stitch deep 5.  Left axillary sentinel lymph nodes with highest count of 1696 Complications: None Drains: None Estimated blood loss 25 cc Special count was correct at completion Disposition to recovery in stable condition  Indications: This is a 77 year old female who had a 4.3 cm breast cancer by MRI at the outset.  We elected to undergo primary endocrine therapy and by MRI this is completely resolved.  Her nodes have remained negative.  We discussed all of her options and elected to attempt a lumpectomy with a sentinel lymph node biopsy.  Procedure: After informed consent was obtained the patient was taken to the operating room.  She had received a pectoral block.  She was injected with technetium in the standard periareolar fashion.  She was given antibiotics.  SCDs were placed.  She was then placed under general anesthesia without complication.  Her breast and axilla were prepped and draped in the standard sterile surgical fashion.  A surgical timeout was then performed.  I elected to do the lumpectomy first.  Identified the radioactive seed in the lower inner quadrant of the breast.  This was fairly close to the skin so I elected to make a curvilinear incision right over it.  I infiltrated Marcaine in the area the incision.  I then used cautery to remove  the seed and the surrounding tissue with an attempt to get a clear margin.  I remove the additional margins as above.  The posterior margin is now the pectoralis muscle.  Mammogram confirmed removal of the clip and the radioactive seed.  This was then sent off the pathology.  I then obtained hemostasis.  I placed clips around this cavity.  The cavity was closed with 2-0 Vicryl.  The dermis was closed with 3-0 Vicryl.  The skin was closed with 4-0 Monocryl.  Glue and Steri-Strips were eventually applied.  I then identified the sentinel node in the low axilla.  I infiltrated Marcaine and made an incision at the lower part of the axillary hairline.  I then carried the incision through the axillary fascia.  There was a small what appeared to be bundle of axillary nodes that were excised with the highest count as listed above.  Hemostasis was obtained.  There is no background radioactivity.  I then closed this with 2-0 Vicryl, 3-0 Vicryl, and 4-0 Monocryl.  Glue was placed over this as well.  She tolerated this well was extubated and transferred to the recovery room in stable condition.

## 2018-09-13 NOTE — Anesthesia Preprocedure Evaluation (Signed)
Anesthesia Evaluation  Patient identified by MRN, date of birth, ID band Patient awake    Reviewed: Allergy & Precautions, NPO status , Patient's Chart, lab work & pertinent test results  History of Anesthesia Complications (+) PONV and history of anesthetic complications  Airway Mallampati: I  TM Distance: >3 FB Neck ROM: Full    Dental no notable dental hx. (+) Teeth Intact, Dental Advisory Given   Pulmonary    Pulmonary exam normal breath sounds clear to auscultation       Cardiovascular hypertension, Pt. on medications Normal cardiovascular exam Rhythm:Regular Rate:Normal  HLD   Neuro/Psych negative neurological ROS  negative psych ROS   GI/Hepatic negative GI ROS, Neg liver ROS,   Endo/Other  negative endocrine ROS  Renal/GU negative Renal ROS  negative genitourinary   Musculoskeletal negative musculoskeletal ROS (+)   Abdominal   Peds  Hematology negative hematology ROS (+)   Anesthesia Other Findings Left breast cancer  Reproductive/Obstetrics                             Anesthesia Physical Anesthesia Plan  ASA: III  Anesthesia Plan: General   Post-op Pain Management:    Induction: Intravenous  PONV Risk Score and Plan: 4 or greater and Dexamethasone, Ondansetron, Midazolam and Scopolamine patch - Pre-op  Airway Management Planned: LMA  Additional Equipment:   Intra-op Plan:   Post-operative Plan: Extubation in OR  Informed Consent: I have reviewed the patients History and Physical, chart, labs and discussed the procedure including the risks, benefits and alternatives for the proposed anesthesia with the patient or authorized representative who has indicated his/her understanding and acceptance.   Dental advisory given  Plan Discussed with: CRNA  Anesthesia Plan Comments:         Anesthesia Quick Evaluation

## 2018-09-13 NOTE — H&P (Signed)
Norma Simon is an 77 y.o. female.   Chief Complaint: left breast cancer HPI: 63 yof I saw months ago for left breast cancer she has no personal history of breast cancer. she does have prior stereo biopsy on right that is benign. she has no palpable mass or dc. she has fh of her mom at age 61 and 57. she underwent screening mammogram that showed a 2.5 cm left lower inner quadrant mass with b density breasts. she underwent US that shows a 3 cm mass. [place] of axilla is negative. 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 she elected to proceed with primary antiestrogen therapy. initial mri showed a 4.3x2.1x2.2 cm with negattive nodes. repeat mr shows near complete resolution of mass. she is doing well without any real changes since last visit.  Past Medical History:  Diagnosis Date  . Cancer ([place]) 04/2018   left breast cancer  . Complication of anesthesia   . Hyperlipemia   . Hypertension   . PONV (postoperative nausea and vomiting)     Past Surgical History:  Procedure Laterality Date  . WRIST SURGERY      Family History  Problem Relation Age of Onset  . Breast cancer Mother   . Lung cancer Father    Social History:  reports that she has never smoked. She has never used smokeless tobacco. She reports that she does not drink alcohol or use drugs.  Allergies: No Known Allergies  No medications prior to admission.    No results found for this or any previous visit (from the past 48 hour(s)). No results found.  Review of Systems  All other systems reviewed and are negative.   Height 5\' 4"  (1.626 m), weight 64 kg. Physical Exam  Vitals  Weight: 140 lb Height: 64in Body Surface Area: 1.68 m Body Mass Index: 24.03 kg/m  Pulse: 62 (Regular)  BP: 146/82 (Sitting, Left Arm, Standard) Physical Exam Rolm Bookbinder MD; 09/02/2018 6:37 PM) General Mental Status-Alert. Orientation-Oriented X3. Chest  and Lung Exam Chest and lung exam reveals -quiet, even and easy respiratory effort with no use of accessory muscles and on auscultation, normal breath sounds, no adventitious sounds and normal vocal resonance. Breast Nipples-No Discharge. Breast Lump-No Palpable Breast Mass. Cardiovascular Cardiovascular examination reveals -normal heart sounds, regular rate and rhythm with no murmurs. Lymphatic Head & Neck General Head & Neck Lymphatics: Bilateral - Description - Normal. Axillary General Axillary Region: Bilateral - Description - Normal. 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 We discussed the performance of that with injection of radioactive tracer. We discussed that there is a chance of having a positive node with a sentinel lymph node biopsy and we will await the permanent pathology to make any other first further decisions in terms of her treatment. One of these options might be to return to the operating room to perform an axillary lymph node dissection. We discussed up to a 5% risk lifetime of chronic shoulder pain as well as lymphedema associated with a sentinel lymph node biopsy. We discussed the options for treatment of the breast cancer which included lumpectomy  versus a mastectomy. We discussed the performance of the lumpectomy with radioactive seed placement. We discussed a 5-10% chance of a positive margin requiring reexcision in the operating room. We also discussed that she will likely need radiation therapy if she undergoes lumpectomy. The breast cannot undergo more radiation therapy in the same breast after  lumpectomy in the future. We discussed mastectomy and the postoperative care for that as well. Mastectomy can be followed by reconstruction. The decision for lumpectomy vs mastectomy has no impact on decision for chemotherapy. Most mastectomy patients will not need radiation therapy. We discussed that there is no difference in her survival whether she undergoes lumpectomy with radiation therapy or antiestrogen therapy versus a mastectomy. There is also no real difference between her recurrence in the breast. We discussed the risks of operation including bleeding, infection, possible reoperation. She understands her further therapy will be based on what her stages at the time of her operation. Rolm Bookbinder, MD 09/13/2018, 11:59 AM

## 2018-09-13 NOTE — Transfer of Care (Signed)
Immediate Anesthesia Transfer of Care Note  Patient: Norma Simon  Procedure(s) Performed: BREAST LUMPECTOMY WITH RADIOACTIVE SEED AND SENTINEL LYMPH NODE BIOPSY (Left Breast)  Patient Location: PACU  Anesthesia Type:GA combined with regional for post-op pain  Level of Consciousness: awake, alert , oriented, drowsy and patient cooperative  Airway & Oxygen Therapy: Patient Spontanous Breathing and Patient connected to face mask oxygen  Post-op Assessment: Report given to RN and Post -op Vital signs reviewed and stable  Post vital signs: Reviewed and stable  Last Vitals:  Vitals Value Taken Time  BP 131/79 09/13/2018  3:27 PM  Temp    Pulse 94 09/13/2018  3:29 PM  Resp 17 09/13/2018  3:29 PM  SpO2 100 % 09/13/2018  3:29 PM  Vitals shown include unvalidated device data.  Last Pain:  Vitals:   09/13/18 1305  TempSrc: Oral  PainSc: 0-No pain         Complications: No apparent anesthesia complications

## 2018-09-13 NOTE — Progress Notes (Signed)
   Assisted Dr. Woodrum with left, ultrasound guided, pectoralis block. Side rails up, monitors on throughout procedure. See vital signs in flow sheet. Tolerated Procedure well. 

## 2018-09-13 NOTE — Anesthesia Procedure Notes (Signed)
Procedure Name: LMA Insertion Date/Time: 09/13/2018 2:27 PM Performed by: Raenette Rover, CRNA Pre-anesthesia Checklist: Patient identified, Suction available, Emergency Drugs available and Patient being monitored Patient Re-evaluated:Patient Re-evaluated prior to induction Oxygen Delivery Method: Circle system utilized Preoxygenation: Pre-oxygenation with 100% oxygen Induction Type: IV induction LMA: LMA inserted LMA Size: 4.0 Number of attempts: 1 Placement Confirmation: positive ETCO2,  CO2 detector and breath sounds checked- equal and bilateral Tube secured with: Tape Dental Injury: Teeth and Oropharynx as per pre-operative assessment

## 2018-09-14 ENCOUNTER — Encounter (HOSPITAL_BASED_OUTPATIENT_CLINIC_OR_DEPARTMENT_OTHER): Payer: Self-pay | Admitting: General Surgery

## 2018-09-14 DIAGNOSIS — C50312 Malignant neoplasm of lower-inner quadrant of left female breast: Secondary | ICD-10-CM | POA: Diagnosis not present

## 2018-09-15 NOTE — Addendum Note (Signed)
Addendum  created 09/15/18 0928 by Tawni Millers, CRNA   Charge Capture section accepted

## 2018-09-20 ENCOUNTER — Inpatient Hospital Stay: Payer: Medicare Other | Attending: Hematology and Oncology | Admitting: Hematology and Oncology

## 2018-09-20 ENCOUNTER — Telehealth: Payer: Self-pay | Admitting: Hematology and Oncology

## 2018-09-20 DIAGNOSIS — Z79811 Long term (current) use of aromatase inhibitors: Secondary | ICD-10-CM

## 2018-09-20 DIAGNOSIS — C50312 Malignant neoplasm of lower-inner quadrant of left female breast: Secondary | ICD-10-CM | POA: Insufficient documentation

## 2018-09-20 DIAGNOSIS — Z7982 Long term (current) use of aspirin: Secondary | ICD-10-CM | POA: Diagnosis not present

## 2018-09-20 DIAGNOSIS — Z170 Estrogen receptor positive status [ER+]: Secondary | ICD-10-CM | POA: Insufficient documentation

## 2018-09-20 DIAGNOSIS — Z79899 Other long term (current) drug therapy: Secondary | ICD-10-CM

## 2018-09-20 MED ORDER — LETROZOLE 2.5 MG PO TABS: 2.5000 mg | ORAL_TABLET | Freq: Every day | ORAL | 3 refills | Status: DC

## 2018-09-20 NOTE — Assessment & Plan Note (Signed)
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   09/13/2018:Left lumpectomy: Invasive lobular cancer, grade 1, microscopic focus, ALH, 0/2 lymph nodes negative, ER 90%, PR 100%, HER-2 negative, Ki-67 10%, ypTmic, ypN0.  Near complete response  Pathology counseling: I discussed the final pathology report of the patient provided  a copy of this report. I discussed the margins as well as lymph node surgeries. We also discussed the final staging along with previously performed ER/PR and HER-2/neu testing.  Recommendation: 1.  Adjuvant radiation therapy followed by 2. continued adjuvant antiestrogen therapy with letrozole 2.5 mg daily x 7 years

## 2018-09-20 NOTE — Progress Notes (Signed)
Patient Care Team: Harlan Stains, MD as PCP - General (Family Medicine)  DIAGNOSIS:  Encounter Diagnosis  Name Primary?  . Malignant neoplasm of lower-inner quadrant of left breast in female, estrogen receptor positive ([place])     SUMMARY OF ONCOLOGIC HISTORY:   Malignant neoplasm of lower-inner quadrant of left breast in female, estrogen receptor positive ([place])   01/10/2018 Initial Diagnosis    Screening detected left breast mass ill-defined LIQ 3 cm at 8 o'clock position axilla negative, ultrasound biopsy: Invasive lobular cancer grade 1-2, ER 90%, PR 100%, Ki-67 10%, HER-2 negative, T2N0 stage Ib clinical stage AJCC 8    02/08/2018 -  Anti-estrogen oral therapy    Letrozole Neoadj therapy    09/13/2018 Surgery    Left lumpectomy: Invasive lobular cancer, grade 1, microscopic focus, ALH, 0/2 lymph nodes negative, ER 90%, PR 100%, HER-2 negative, Ki-67 10%, ypTmic, ypN0.  Near complete response     CHIEF COMPLIANT: Follow-up after recent left lumpectomy  INTERVAL HISTORY: Norma Simon is a 77 year old with above-mentioned history of breast cancer treated with neoadjuvant letrozole therapy and underwent recent lumpectomy.  She had a dramatic improvement in her cancer.  The tumor shrunk in size from almost 4 cm down to microscopic disease.  She has tolerated letrozole extremely well.  She is here to talk about the further treatment plan.  REVIEW OF SYSTEMS:   Constitutional: Denies fevers, chills or abnormal weight loss Eyes: Denies blurriness of vision Ears, nose, mouth, throat, and face: Denies mucositis or sore throat Respiratory: Denies cough, dyspnea or wheezes Cardiovascular: Denies palpitation, chest discomfort Gastrointestinal:  Denies nausea, heartburn or change in bowel habits Skin: Denies abnormal skin rashes Lymphatics: Denies new lymphadenopathy or easy bruising Neurological:Denies numbness, tingling or new weaknesses Behavioral/Psych: Mood is stable, no new changes   Extremities: No lower extremity edema Breast: Recent left lumpectomy All other systems were reviewed with the patient and are negative.  I have reviewed the past medical history, past surgical history, social history and family history with the patient and they are unchanged from previous note.  ALLERGIES:  has No Known Allergies.  MEDICATIONS:  Current Outpatient Medications  Medication Sig Dispense Refill  . amLODipine (NORVASC) 5 MG tablet Take 2.5 mg by mouth daily.     Marland Kitchen aspirin EC 81 MG tablet Take 81 mg by mouth daily.    . benazepril-hydrochlorthiazide (LOTENSIN HCT) 20-25 MG per tablet Take 1 tablet by mouth daily.    . calcium carbonate (OS-CAL) 600 MG TABS Take 600 mg by mouth daily.    . cholecalciferol (VITAMIN D) 1000 UNITS tablet Take 1,000 Units by mouth daily.    Marland Kitchen letrozole (FEMARA) 2.5 MG tablet Take 1 tablet (2.5 mg total) by mouth daily. 90 tablet 3  . Multiple Vitamin (MULTIVITAMIN WITH MINERALS) TABS Take 1 tablet by mouth daily.    Marland Kitchen oxyCODONE (OXY IR/ROXICODONE) 5 MG immediate release tablet Take 1 tablet (5 mg total) by mouth every 6 (six) hours as needed for moderate pain, severe pain or breakthrough pain. 10 tablet 0  . pravastatin (PRAVACHOL) 40 MG tablet Take 40 mg by mouth every evening.    . vitamin E 400 UNIT capsule Take 400 Units by mouth daily.     No current facility-administered medications for this visit.     PHYSICAL EXAMINATION: ECOG PERFORMANCE STATUS: 0 - Asymptomatic  Vitals:   09/20/18 1427  BP: (!) 152/80  Pulse: 61  Resp: 18  Temp: 98 F (36.7 C)  SpO2:  99%   Filed Weights   09/20/18 1427  Weight: 140 lb 12.8 oz (63.9 kg)    GENERAL:alert, no distress and comfortable SKIN: skin color, texture, turgor are normal, no rashes or significant lesions EYES: normal, Conjunctiva are pink and non-injected, sclera clear OROPHARYNX:no exudate, no erythema and lips, buccal mucosa, and tongue normal  NECK: supple, thyroid normal size,  non-tender, without nodularity LYMPH:  no palpable lymphadenopathy in the cervical, axillary or inguinal LUNGS: clear to auscultation and percussion with normal breathing effort HEART: regular rate & rhythm and no murmurs and no lower extremity edema ABDOMEN:abdomen soft, non-tender and normal bowel sounds MUSCULOSKELETAL:no cyanosis of digits and no clubbing  NEURO: alert & oriented x 3 with fluent speech, no focal motor/sensory deficits EXTREMITIES: No lower extremity edema   LABORATORY DATA:  I have reviewed the data as listed CMP Latest Ref Rng & Units 08/15/2018 01/26/2018 10/24/2012  Glucose 70 - 99 mg/dL 103(H) 131 106(H)  BUN 8 - 23 mg/dL '19 20 17  '$ Creatinine 0.44 - 1.00 mg/dL 0.95 0.97 0.87  Sodium 135 - 145 mmol/L 141 142 139  Potassium 3.5 - 5.1 mmol/L 3.6 3.5 3.6  Chloride 98 - 111 mmol/L 103 105 101  CO2 22 - 32 mmol/L '28 27 27  '$ Calcium 8.9 - 10.3 mg/dL 9.7 9.5 9.5  Total Protein 6.5 - 8.1 g/dL 7.5 7.3 -  Total Bilirubin 0.3 - 1.2 mg/dL 0.5 0.5 -  Alkaline Phos 38 - 126 U/L 53 48 -  AST 15 - 41 U/L 14(L) 15 -  ALT 0 - 44 U/L 13 14 -    Lab Results  Component Value Date   WBC 6.4 08/15/2018   HGB 14.1 08/15/2018   HCT 42.1 08/15/2018   MCV 89.2 08/15/2018   PLT 236 08/15/2018   NEUTROABS 4.3 08/15/2018    ASSESSMENT & PLAN:  Malignant neoplasm of lower-inner quadrant of left breast in female, estrogen receptor positive ([place]) 01/10/2018:Screening detected left breast mass ill-defined LIQ 3 cm at 8 o'clock position axilla negative, ultrasound biopsy: Invasive lobular cancer grade 1-2, ER 90%, PR 100%, Ki-67 10%, HER-2 negative, T2N0 stage Ib clinical stage AJCC 8  MRI Breast 02/01/18: Mass and NME extends 4.3 X 2.1 X 2.2 cm, No abnormal LN  LetrozoleToxicities: Denies any hot flashes or myalgias.   09/13/2018:Left lumpectomy: Invasive lobular cancer, grade 1, microscopic focus, ALH, 0/2 lymph nodes negative, ER 90%, PR 100%, HER-2 negative, Ki-67 10%, ypTmic, ypN0.   Near complete response  Pathology counseling: I discussed the final pathology report of the patient provided  a copy of this report. I discussed the margins as well as lymph node surgeries. We also discussed the final staging along with previously performed ER/PR and HER-2/neu testing.  Recommendation: 1.  Given the excellent results from antiestrogen therapy and her age, I do not believe she will benefit from adjuvant radiation.  However we will discuss this in the tumor board and make final determination.  I will let her know if she were to need radiation. 2. continued adjuvant antiestrogen therapy with letrozole 2.5 mg daily x 7 years  Patient enjoys knitting caps for newborn babies. Return to clinic in 6 months for survivorship care plan visit No orders of the defined types were placed in this encounter.  The patient has a good understanding of the overall plan. she agrees with it. she will call with any problems that may develop before the next visit here.   Harriette Ohara, MD 09/20/18

## 2018-09-20 NOTE — Telephone Encounter (Signed)
Gave pt avs and calendar  °

## 2018-10-12 ENCOUNTER — Encounter: Payer: Self-pay | Admitting: Radiation Oncology

## 2018-10-12 ENCOUNTER — Other Ambulatory Visit: Payer: Self-pay | Admitting: *Deleted

## 2018-10-12 DIAGNOSIS — Z170 Estrogen receptor positive status [ER+]: Principal | ICD-10-CM

## 2018-10-12 DIAGNOSIS — C50312 Malignant neoplasm of lower-inner quadrant of left female breast: Secondary | ICD-10-CM

## 2018-10-12 NOTE — Progress Notes (Signed)
Location of Breast Cancer:Malignant neoplasm of lower-inner quadrant of left breast in female, estrogen receptor positive   Histology per Pathology Report: FINAL DIAGNOSIS Diagnosis 01-19-18 Breast, left, needle core biopsy - INVASIVE MAMMARY CARCINOMA. - SEE COMMENT. Microscopic Comment The carcinoma appears grade 1-2. An E-cadherin stain and a breast prognostic profile will be performed and the result reported separately. The results are called to [facility] on 01/20/18. (JBK:gt, 01/20/18) Enid Cutter MD Pathologist, Electronic Signature (Case signed 01/20/2018) Specimen Gross and Clinical Information Specimen Comment In formalin @ 9:47; mixed echos in irregular mass  Receptor Status: ER(90 % +), PR (100 % +), Her2-neu (-), Ki-(10 %)  Did patient present with symptoms (if so, please note symptoms) or was this found on screening mammography?: Screening detected left breast mass   Past/Anticipated interventions by surgeon, if any: INAL DIAGNOSIS Diagnosis 09-13-18 Dr. Rolm Bookbinder 1. Breast, lumpectomy, Left w/seed - INVASIVE LOBULAR CARCINOMA, GRADE I/III, MICROSCOPIC FOCUS. - ATYPICAL LOBULAR HYPERPLASIA. - THE SURGICAL RESECTION MARGINS ARE NEGATIVE FOR CARCINOMA. - SEE ONCOLOGY TABLE BELOW. 2. Lymph node, sentinel, biopsy, Left Axillary - THERE IS NO EVIDENCE OF CARCINOMA IN 1 OF 1 LYMPH NODE (0/1). - SEE COMMENT. 3. Lymph node, sentinel, biopsy, Left - THERE IS NO EVIDENCE OF CARCINOMA IN 1 OF 1 LYMPH NODE (0/1). - SEE COMMENT. 4. Breast, excision, Medial Margin - ATYPICAL LOBULAR HYPERPLASIA. - SEE COMMENT. 5. Breast, excision, Inferior Margin - BENIGN BREAST PARENCHYMA. - THERE IS NO EVIDENCE OF MALIGNANCY. - SEE COMMENT. 6. Breast, excision, Posterior Margin - BENIGN BREAST PARENCHYMA. - THERE IS NO EVIDENCE OF MALIGNANCY. - SEE COMMENT. Microscopic Comment 1. INVASIVE CARCINOMA OF THE BREAST: STATUS POST NEOADJUVANT TREATMENT Resection Procedure:  Lumpectomy. Specimen Laterality: Left.  Receptor Status: ER(90 % +), PR (100 % +), Her2-neu (-), Ki-(10 %)  Past/Anticipated interventions by medical oncology, if any:   Chemotherapy No  MRI Breast 02/01/18: Mass and NME extends 4.3 X 2.1 X 2.2 cm, No abnormal LN MRI breast 08/09/2018: Near complete resolution of the clumped non-mass enhancement within the lower inner quadrant left breast, cardiomegaly  02-08-18 Anti-estrogen oral therapy  Letrozole Neoadj therapy Letrozole Counseling  Adjuvant radiation therapy followed by Continued adjuvant antiestrogen therapy with letrozole 2.5 mg daily x 7 years  Lymphedema issues, if any: No ROM to left arm good. Skin to left breast incision intact skin with old bruising fainting  Follow appointment to see Dr. Rolm Bookbinder 10-04-18  Pain issues, if any: No  SAFETY ISSUES:No  Prior radiation? :No  Pacemaker/ICD? : No  Possible current pregnancy?:No  Is the patient on methotrexate? : No  Menarche 14 G3 P3 BC IUD  LMP 53 Menopause 53 HRT N  Current Complaints / other details:    Wt Readings from Last 3 Encounters:  10/13/18 142 lb (64.4 kg)  09/20/18 140 lb 12.8 oz (63.9 kg)  09/13/18 140 lb 6.9 oz (63.7 kg)  BP (!) 149/68 (BP Location: Right Arm, Patient Position: Sitting)   Pulse 70   Temp 98 F (36.7 C) (Oral)   Resp 20   Ht '5\' 4"'$  (1.626 m)   Wt 142 lb (64.4 kg)   SpO2 100%   BMI 24.37 kg/m    Georgena Spurling, RN 10/12/2018,10:05 AM

## 2018-10-13 ENCOUNTER — Encounter: Payer: Self-pay | Admitting: Radiation Oncology

## 2018-10-13 ENCOUNTER — Ambulatory Visit
Admission: RE | Admit: 2018-10-13 | Discharge: 2018-10-13 | Disposition: A | Discharge status: Home / Self Care | Payer: Medicare Other | Source: Ambulatory Visit | Attending: Radiation Oncology | Admitting: Radiation Oncology

## 2018-10-13 ENCOUNTER — Other Ambulatory Visit: Payer: Self-pay

## 2018-10-13 VITALS — BP 149/68 | HR 70 | Temp 98.0°F | Resp 20 | Ht 64.0 in | Wt 142.0 lb

## 2018-10-13 DIAGNOSIS — Z170 Estrogen receptor positive status [ER+]: Secondary | ICD-10-CM | POA: Insufficient documentation

## 2018-10-13 DIAGNOSIS — Z7982 Long term (current) use of aspirin: Secondary | ICD-10-CM | POA: Diagnosis not present

## 2018-10-13 DIAGNOSIS — Z79899 Other long term (current) drug therapy: Secondary | ICD-10-CM | POA: Insufficient documentation

## 2018-10-13 DIAGNOSIS — I10 Essential (primary) hypertension: Secondary | ICD-10-CM | POA: Insufficient documentation

## 2018-10-13 DIAGNOSIS — Z510 Encounter for antineoplastic radiation therapy: Secondary | ICD-10-CM | POA: Insufficient documentation

## 2018-10-13 DIAGNOSIS — Z9889 Other specified postprocedural states: Secondary | ICD-10-CM | POA: Diagnosis not present

## 2018-10-13 DIAGNOSIS — C50312 Malignant neoplasm of lower-inner quadrant of left female breast: Secondary | ICD-10-CM | POA: Diagnosis not present

## 2018-10-13 DIAGNOSIS — Z9221 Personal history of antineoplastic chemotherapy: Secondary | ICD-10-CM | POA: Diagnosis not present

## 2018-10-13 NOTE — Progress Notes (Signed)
  Radiation Oncology         [phone]) [phone] ________________________________  Name: Norma Simon MRN: [id number]  Date: 10/13/2018  DOB: 1941/01/18  Optical Surface Tracking Plan:  Since intensity modulated radiotherapy (IMRT) and 3D conformal radiation treatment methods are predicated on accurate and precise positioning for treatment, intrafraction motion monitoring is medically necessary to ensure accurate and safe treatment delivery.  The ability to quantify intrafraction motion without excessive ionizing radiation dose can only be performed with optical surface tracking. Accordingly, surface imaging offers the opportunity to obtain 3D measurements of patient position throughout IMRT and 3D treatments without excessive radiation exposure.  I am ordering optical surface tracking for this patient's upcoming course of radiotherapy. ________________________________  Kyung Rudd, MD 10/13/2018 4:54 PM    Reference:   Ursula Alert, J, et al. Surface imaging-based analysis of intrafraction motion for breast radiotherapy patients.Journal of [place], n. 6, nov. 2014. ISSN [id number].   Available at: <http://www.jacmp.org/index.php/jacmp/article/view/4957>.

## 2018-10-13 NOTE — Progress Notes (Signed)
  Radiation Oncology         [phone]) [phone] ________________________________  Name: Norma Simon MRN: [id number]  Date: 10/13/2018  DOB: May 10, 1941   DIAGNOSIS:     ICD-10-CM   1. Malignant neoplasm of lower-inner quadrant of left breast in female, estrogen receptor positive ([place]) C50.312    Z17.0     SIMULATION AND TREATMENT PLANNING NOTE  The patient presented for simulation prior to beginning her course of radiation treatment for her diagnosis of left-sided breast cancer. The patient was placed in a supine position on a breast board. A customized vac-lock bag was constructed and this complex treatment device will be used on a daily basis during her treatment. In this fashion, a CT scan was obtained through the chest area and an isocenter was placed near the chest wall within the breast.  The patient will be planned to receive a course of radiation initially to a dose of 42.56 Gy. This will consist of a whole breast radiotherapy technique. To accomplish this, 2 customized blocks have been designed which will correspond to medial and lateral whole breast tangent fields. This treatment will be accomplished at 2.66 Gy per fraction. A forward planning technique will also be evaluated to determine if this approach improves the plan. It is anticipated that the patient will then receive a 8 Gy boost to the seroma cavity which has been contoured. This will be accomplished at 2 Gy per fraction.   This initial treatment will consist of a 3-D conformal technique. The seroma has been contoured as the primary target structure. Additionally, dose volume histograms of both this target as well as the lungs and heart will also be evaluated. Such an approach is necessary to ensure that the target area is adequately covered while the nearby critical  normal structures are adequately spared.  Plan:  The final anticipated total dose therefore will correspond to 50.56 Gy.  Special treatment procedure was  performed today due to the extra time and effort required by myself to plan and prepare this patient for deep inspiration breath hold technique.  I have determined cardiac sparing to be of benefit to this patient to prevent long term cardiac damage due to radiation of the heart.  Bellows were placed on the patient's abdomen. To facilitate cardiac sparing, the patient was coached by the radiation therapists on breath hold techniques and breathing practice was performed. Practice waveforms were obtained. The patient was then scanned while maintaining breath hold in the treatment position.  This image was then transferred over to the imaging specialist. The imaging specialist then created a fusion of the free breathing and breath hold scans using the chest wall as the stable structure. I personally reviewed the fusion in axial, coronal and sagittal image planes.  Excellent cardiac sparing was obtained.  I felt the patient is an appropriate candidate for breath hold and the patient will be treated as such.  The image fusion was then reviewed with the patient to reinforce the necessity of reproducible breath hold.     _______________________________   Jodelle Gross, MD, PhD

## 2018-10-13 NOTE — Progress Notes (Signed)
Radiation Oncology         [phone] ________________________________  Name: Stephanee Suman        MRN: [id number]  Date of Service: 10/13/2018 DOB: 08/30/1941  CC:White, Cynthia, MD  Wakefield, Matthew, MD     REFERRING PHYSICIAN: Wakefield, Matthew, MD   DIAGNOSIS: The encounter diagnosis was Malignant neoplasm of lower-inner quadrant of left breast in female, estrogen receptor positive ([facility]).   HISTORY OF PRESENT ILLNESS: Norma Simon is a 77 y.o. female seen in the multidisciplinary breast clinic for a new diagnosis of left breast cancer. The patient was noted to have a screening detected mass on 01/01/18 in the lower inner quadrant of the left breast. Diagnostic imaging revealed a 3 cm mass at 8:00. 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  She elected to begin neoadjuvant antiestrogen therapy, she did also have a pre-treatment MRI scan that revealed her masses of 4.3 x 2 x 2.2 cm tumor with no evidence of adenopathy.  Her post neoadjuvant MRI scan on 08/17/2018 revealed only very small evidence of enhancement at the clip site.  She underwent lumpectomy on 09/13/2018 which revealed microscopic invasive lobular carcinoma grade 1 class I residual tumor burden scoring with negative margins and 3 sampled nodes that were negative.  Her tumor was still a ER PR positive HER-2 negative with a Ki-67 of 10%.  Her case in conference was discussed, she would be a candidate to consider adjuvant radiotherapy and comes to discuss this today followed by continuation of antiestrogen therapy.     PREVIOUS RADIATION THERAPY: No   PAST MEDICAL HISTORY:  Past Medical History:  Diagnosis Date  . Cancer (HCC) 04/2018   left breast cancer  . Complication of anesthesia   . Hyperlipemia   . Hypertension   . PONV (postoperative nausea and vomiting)         PAST SURGICAL HISTORY: Past Surgical History:  Procedure Laterality Date  . BREAST LUMPECTOMY WITH RADIOACTIVE SEED AND SENTINEL LYMPH NODE BIOPSY Left 09/13/2018   Procedure: BREAST LUMPECTOMY WITH RADIOACTIVE SEED AND SENTINEL LYMPH NODE BIOPSY;  Surgeon: Wakefield, Matthew, MD;  Location: [facility];  Service: General;  Laterality: Left;  . WRIST SURGERY       FAMILY HISTORY:  Family History  Problem Relation Age of Onset  . Breast cancer Mother   . Lung cancer Father      SOCIAL HISTORY:  reports that she has never smoked. She has never used smokeless tobacco. She reports that she does not drink alcohol or use drugs. The patient is married and lives in [place]. She enjoys playing bridge.   ALLERGIES: Patient has no known allergies.   MEDICATIONS:  Current Outpatient Medications  Medication Sig Dispense Refill  . amLODipine (NORVASC) 5 MG tablet Take 2.5 mg by mouth daily.     . aspirin EC 81 MG tablet Take 81 mg by mouth daily.    . benazepril-hydrochlorthiazide (LOTENSIN HCT) 20-25 MG per tablet Take 1 tablet by mouth daily.    . calcium carbonate (OS-CAL) 600 MG TABS Take 600 mg by mouth daily.    . cholecalciferol (VITAMIN D) 1000 UNITS tablet Take 1,000 Units by mouth daily.    . letrozole (FEMARA) 2.5 MG tablet Take 1 tablet (2.5 mg total) by mouth daily. 90 tablet 3  . Multiple Vitamin (MULTIVITAMIN WITH MINERALS) TABS   Take 1 tablet by mouth daily.    . pravastatin (PRAVACHOL) 40 MG tablet Take 40 mg by mouth every evening.    . vitamin E 400 UNIT capsule Take 400 Units by mouth daily.    . oxyCODONE (OXY IR/ROXICODONE) 5 MG immediate release tablet Take 1 tablet (5 mg total) by mouth every 6 (six) hours as needed for moderate pain, severe pain or breakthrough pain. (Patient not taking: Reported on 10/13/2018) 10 tablet 0   No current facility-administered medications for this encounter.      REVIEW OF SYSTEMS: On review of systems, the  patient reports that she is doing well overall.  She states she is doing extremely well in terms of healing.  She denies any chest pain, shortness of breath, cough, fevers, chills, night sweats, unintended weight changes. She denies any bowel or bladder disturbances, and denies abdominal pain, nausea or vomiting. She denies any new musculoskeletal or joint aches or pains. A complete review of systems is obtained and is otherwise negative.     PHYSICAL EXAM:  Wt Readings from Last 3 Encounters:  10/13/18 142 lb (64.4 kg)  09/20/18 140 lb 12.8 oz (63.9 kg)  09/13/18 140 lb 6.9 oz (63.7 kg)   Temp Readings from Last 3 Encounters:  10/13/18 98 F (36.7 C) (Oral)  09/20/18 98 F (36.7 C) (Oral)  09/13/18 97.6 F (36.4 C)   BP Readings from Last 3 Encounters:  10/13/18 (!) 149/68  09/20/18 (!) 152/80  09/13/18 (!) 147/64   Pulse Readings from Last 3 Encounters:  10/13/18 70  09/20/18 61  09/13/18 64     In general this is a well appearing caucasian female in no acute distress. She's alert and oriented x4 and appropriate throughout the examination. Cardiopulmonary assessment is negative for acute distress and she exhibits normal effort.  The left breast is assessed and reveals a well-healed lumpectomy site without evidence of erythema.   ECOG = 0  0 - Asymptomatic (Fully active, able to carry on all predisease activities without restriction)  1 - Symptomatic but completely ambulatory (Restricted in physically strenuous activity but ambulatory and able to carry out work of a light or sedentary nature. For example, light housework, office work)  2 - Symptomatic, <50% in bed during the day (Ambulatory and capable of all self care but unable to carry out any work activities. Up and about more than 50% of waking hours)  3 - Symptomatic, >50% in bed, but not bedbound (Capable of only limited self-care, confined to bed or chair 50% or more of waking hours)  4 - Bedbound (Completely  disabled. Cannot carry on any self-care. Totally confined to bed or chair)  5 - Death   Oken MM, Creech RH, Tormey DC, et al. (1982). "Toxicity and response criteria of the Eastern Cooperative Oncology Group". Am. J. Clin. Oncol. 5 (6): 649-55    LABORATORY DATA:  Lab Results  Component Value Date   WBC 6.4 08/15/2018   HGB 14.1 08/15/2018   HCT 42.1 08/15/2018   MCV 89.2 08/15/2018   PLT 236 08/15/2018   Lab Results  Component Value Date   NA 141 08/15/2018   K 3.6 08/15/2018   CL 103 08/15/2018   CO2 28 08/15/2018   Lab Results  Component Value Date   ALT 13 08/15/2018   AST 14 (L) 08/15/2018   ALKPHOS 53 08/15/2018   BILITOT 0.5 08/15/2018      RADIOGRAPHY: Nm Sentinel Node Inj-no Rpt (breast)  Result   Date: 09/13/2018 Sulfur colloid was injected by the nuclear medicine technologist for melanoma sentinel node.       IMPRESSION/PLAN: 1. 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 Dr. Moody discusses the pathology findings and reviews the nature of left breast disease. The patient has done well and is healed, she can move forward now with additional adjuvant therapy.  We discussed the discussion that we have already had in conference about her case, she has the opportunity to consider radiotherapy if she would like to be as aggressive about reducing her risk of local recurrence which she is.  We discussed the risks, benefits, short, and long term effects of radiotherapy, and the patient is interested in proceeding. Dr. Moody discusses the delivery and logistics of radiotherapy and offers a course of 4 weeks with deep inspiration breath hold technique. Written consent is obtained and placed in the chart, a copy was provided to the patient. She will simulate this morning.  In a visit lasting 25 minutes, greater than 50% of the time was spent face to face discussing her case, and coordinating the patient's care.   The above  documentation reflects my direct findings during this shared patient visit. Please see the separate note by Dr. Moody on this date for the remainder of the patient's plan of care.    Alison C. Perkins, PAC   

## 2018-10-17 ENCOUNTER — Encounter: Payer: Self-pay | Admitting: *Deleted

## 2018-10-18 DIAGNOSIS — Z170 Estrogen receptor positive status [ER+]: Secondary | ICD-10-CM | POA: Diagnosis not present

## 2018-10-18 DIAGNOSIS — C50312 Malignant neoplasm of lower-inner quadrant of left female breast: Secondary | ICD-10-CM | POA: Diagnosis not present

## 2018-10-18 DIAGNOSIS — Z510 Encounter for antineoplastic radiation therapy: Secondary | ICD-10-CM | POA: Diagnosis not present

## 2018-10-20 ENCOUNTER — Ambulatory Visit
Admission: RE | Admit: 2018-10-20 | Discharge: 2018-10-20 | Disposition: A | Discharge status: Home / Self Care | Payer: Medicare Other | Source: Ambulatory Visit | Attending: Radiation Oncology | Admitting: Radiation Oncology

## 2018-10-20 DIAGNOSIS — Z170 Estrogen receptor positive status [ER+]: Principal | ICD-10-CM

## 2018-10-20 DIAGNOSIS — C50312 Malignant neoplasm of lower-inner quadrant of left female breast: Secondary | ICD-10-CM | POA: Diagnosis not present

## 2018-10-20 DIAGNOSIS — Z510 Encounter for antineoplastic radiation therapy: Secondary | ICD-10-CM | POA: Diagnosis not present

## 2018-10-20 MED ORDER — ALRA NON-METALLIC DEODORANT (RAD-ONC)
1.0000 "application " | Freq: Once | TOPICAL | Status: AC
  Administered 2018-10-20: 1 via TOPICAL

## 2018-10-20 MED ORDER — RADIAPLEXRX EX GEL
Freq: Once | CUTANEOUS | Status: AC
  Administered 2018-10-20: 16:00:00 via TOPICAL

## 2018-10-20 NOTE — Progress Notes (Signed)
Pt here for patient teaching.  Pt given Radiation and You booklet, skin care instructions, Alra deodorant and Radiaplex gel.  Reviewed areas of pertinence such as fatigue, hair loss, skin changes, breast tenderness and breast swelling . Pt able to give teach back of to pat skin and use unscented/gentle soap,apply Radiaplex bid, avoid applying anything to skin within 4 hours of treatment, avoid wearing an under wire bra and to use an electric razor if they must shave. Pt demonstrated understanding, of information given and will contact nursing with any questions or concerns.     Http://rtanswers.org/treatmentinformation/whattoexpect/index      

## 2018-10-21 ENCOUNTER — Ambulatory Visit
Admission: RE | Admit: 2018-10-21 | Discharge: 2018-10-21 | Disposition: A | Discharge status: Home / Self Care | Payer: Medicare Other | Source: Ambulatory Visit | Attending: Radiation Oncology | Admitting: Radiation Oncology

## 2018-10-21 DIAGNOSIS — C50312 Malignant neoplasm of lower-inner quadrant of left female breast: Secondary | ICD-10-CM | POA: Diagnosis not present

## 2018-10-21 DIAGNOSIS — Z510 Encounter for antineoplastic radiation therapy: Secondary | ICD-10-CM | POA: Insufficient documentation

## 2018-10-21 DIAGNOSIS — Z170 Estrogen receptor positive status [ER+]: Secondary | ICD-10-CM | POA: Insufficient documentation

## 2018-10-24 ENCOUNTER — Ambulatory Visit
Admission: RE | Admit: 2018-10-24 | Discharge: 2018-10-24 | Disposition: A | Discharge status: Home / Self Care | Payer: Medicare Other | Source: Ambulatory Visit | Attending: Radiation Oncology | Admitting: Radiation Oncology

## 2018-10-24 DIAGNOSIS — C50312 Malignant neoplasm of lower-inner quadrant of left female breast: Secondary | ICD-10-CM | POA: Diagnosis not present

## 2018-10-24 DIAGNOSIS — Z170 Estrogen receptor positive status [ER+]: Secondary | ICD-10-CM | POA: Diagnosis not present

## 2018-10-24 DIAGNOSIS — Z510 Encounter for antineoplastic radiation therapy: Secondary | ICD-10-CM | POA: Diagnosis not present

## 2018-10-25 ENCOUNTER — Ambulatory Visit
Admission: RE | Admit: 2018-10-25 | Discharge: 2018-10-25 | Disposition: A | Discharge status: Home / Self Care | Payer: Medicare Other | Source: Ambulatory Visit | Attending: Radiation Oncology | Admitting: Radiation Oncology

## 2018-10-25 DIAGNOSIS — Z510 Encounter for antineoplastic radiation therapy: Secondary | ICD-10-CM | POA: Diagnosis not present

## 2018-10-25 DIAGNOSIS — C50312 Malignant neoplasm of lower-inner quadrant of left female breast: Secondary | ICD-10-CM | POA: Diagnosis not present

## 2018-10-25 DIAGNOSIS — Z170 Estrogen receptor positive status [ER+]: Secondary | ICD-10-CM | POA: Diagnosis not present

## 2018-10-26 ENCOUNTER — Ambulatory Visit
Admission: RE | Admit: 2018-10-26 | Discharge: 2018-10-26 | Disposition: A | Discharge status: Home / Self Care | Payer: Medicare Other | Source: Ambulatory Visit | Attending: Radiation Oncology | Admitting: Radiation Oncology

## 2018-10-26 DIAGNOSIS — Z510 Encounter for antineoplastic radiation therapy: Secondary | ICD-10-CM | POA: Diagnosis not present

## 2018-10-26 DIAGNOSIS — C50312 Malignant neoplasm of lower-inner quadrant of left female breast: Secondary | ICD-10-CM | POA: Diagnosis not present

## 2018-10-26 DIAGNOSIS — Z170 Estrogen receptor positive status [ER+]: Secondary | ICD-10-CM | POA: Diagnosis not present

## 2018-10-27 ENCOUNTER — Ambulatory Visit
Admission: RE | Admit: 2018-10-27 | Discharge: 2018-10-27 | Disposition: A | Discharge status: Home / Self Care | Payer: Medicare Other | Source: Ambulatory Visit | Attending: Radiation Oncology | Admitting: Radiation Oncology

## 2018-10-27 DIAGNOSIS — Z170 Estrogen receptor positive status [ER+]: Secondary | ICD-10-CM | POA: Diagnosis not present

## 2018-10-27 DIAGNOSIS — C50312 Malignant neoplasm of lower-inner quadrant of left female breast: Secondary | ICD-10-CM | POA: Diagnosis not present

## 2018-10-27 DIAGNOSIS — Z510 Encounter for antineoplastic radiation therapy: Secondary | ICD-10-CM | POA: Diagnosis not present

## 2018-10-28 ENCOUNTER — Ambulatory Visit
Admission: RE | Admit: 2018-10-28 | Discharge: 2018-10-28 | Disposition: A | Discharge status: Home / Self Care | Payer: Medicare Other | Source: Ambulatory Visit | Attending: Radiation Oncology | Admitting: Radiation Oncology

## 2018-10-28 DIAGNOSIS — Z170 Estrogen receptor positive status [ER+]: Secondary | ICD-10-CM | POA: Diagnosis not present

## 2018-10-28 DIAGNOSIS — C50312 Malignant neoplasm of lower-inner quadrant of left female breast: Secondary | ICD-10-CM | POA: Diagnosis not present

## 2018-10-28 DIAGNOSIS — Z510 Encounter for antineoplastic radiation therapy: Secondary | ICD-10-CM | POA: Diagnosis not present

## 2018-10-31 ENCOUNTER — Ambulatory Visit
Admission: RE | Admit: 2018-10-31 | Discharge: 2018-10-31 | Disposition: A | Discharge status: Home / Self Care | Payer: Medicare Other | Source: Ambulatory Visit | Attending: Radiation Oncology | Admitting: Radiation Oncology

## 2018-10-31 DIAGNOSIS — C50312 Malignant neoplasm of lower-inner quadrant of left female breast: Secondary | ICD-10-CM | POA: Diagnosis not present

## 2018-10-31 DIAGNOSIS — Z170 Estrogen receptor positive status [ER+]: Secondary | ICD-10-CM | POA: Diagnosis not present

## 2018-10-31 DIAGNOSIS — Z510 Encounter for antineoplastic radiation therapy: Secondary | ICD-10-CM | POA: Diagnosis not present

## 2018-11-01 ENCOUNTER — Ambulatory Visit
Admission: RE | Admit: 2018-11-01 | Discharge: 2018-11-01 | Disposition: A | Discharge status: Home / Self Care | Payer: Medicare Other | Source: Ambulatory Visit | Attending: Radiation Oncology | Admitting: Radiation Oncology

## 2018-11-01 DIAGNOSIS — Z170 Estrogen receptor positive status [ER+]: Secondary | ICD-10-CM | POA: Diagnosis not present

## 2018-11-01 DIAGNOSIS — C50312 Malignant neoplasm of lower-inner quadrant of left female breast: Secondary | ICD-10-CM | POA: Diagnosis not present

## 2018-11-01 DIAGNOSIS — Z510 Encounter for antineoplastic radiation therapy: Secondary | ICD-10-CM | POA: Diagnosis not present

## 2018-11-02 ENCOUNTER — Ambulatory Visit
Admission: RE | Admit: 2018-11-02 | Discharge: 2018-11-02 | Disposition: A | Discharge status: Home / Self Care | Payer: Medicare Other | Source: Ambulatory Visit | Attending: Radiation Oncology | Admitting: Radiation Oncology

## 2018-11-02 DIAGNOSIS — C50312 Malignant neoplasm of lower-inner quadrant of left female breast: Secondary | ICD-10-CM | POA: Diagnosis not present

## 2018-11-02 DIAGNOSIS — Z170 Estrogen receptor positive status [ER+]: Secondary | ICD-10-CM | POA: Diagnosis not present

## 2018-11-02 DIAGNOSIS — Z510 Encounter for antineoplastic radiation therapy: Secondary | ICD-10-CM | POA: Diagnosis not present

## 2018-11-03 ENCOUNTER — Ambulatory Visit
Admission: RE | Admit: 2018-11-03 | Discharge: 2018-11-03 | Disposition: A | Discharge status: Home / Self Care | Payer: Medicare Other | Source: Ambulatory Visit | Attending: Radiation Oncology | Admitting: Radiation Oncology

## 2018-11-03 DIAGNOSIS — Z510 Encounter for antineoplastic radiation therapy: Secondary | ICD-10-CM | POA: Diagnosis not present

## 2018-11-03 DIAGNOSIS — Z170 Estrogen receptor positive status [ER+]: Secondary | ICD-10-CM | POA: Diagnosis not present

## 2018-11-03 DIAGNOSIS — C50312 Malignant neoplasm of lower-inner quadrant of left female breast: Secondary | ICD-10-CM | POA: Diagnosis not present

## 2018-11-04 ENCOUNTER — Ambulatory Visit
Admission: RE | Admit: 2018-11-04 | Discharge: 2018-11-04 | Disposition: A | Discharge status: Home / Self Care | Payer: Medicare Other | Source: Ambulatory Visit | Attending: Radiation Oncology | Admitting: Radiation Oncology

## 2018-11-04 DIAGNOSIS — Z170 Estrogen receptor positive status [ER+]: Secondary | ICD-10-CM | POA: Diagnosis not present

## 2018-11-04 DIAGNOSIS — C50312 Malignant neoplasm of lower-inner quadrant of left female breast: Secondary | ICD-10-CM | POA: Diagnosis not present

## 2018-11-04 DIAGNOSIS — Z510 Encounter for antineoplastic radiation therapy: Secondary | ICD-10-CM | POA: Diagnosis not present

## 2018-11-07 ENCOUNTER — Ambulatory Visit
Admission: RE | Admit: 2018-11-07 | Discharge: 2018-11-07 | Disposition: A | Discharge status: Home / Self Care | Payer: Medicare Other | Source: Ambulatory Visit | Attending: Radiation Oncology | Admitting: Radiation Oncology

## 2018-11-07 DIAGNOSIS — C50312 Malignant neoplasm of lower-inner quadrant of left female breast: Secondary | ICD-10-CM | POA: Diagnosis not present

## 2018-11-07 DIAGNOSIS — Z170 Estrogen receptor positive status [ER+]: Secondary | ICD-10-CM | POA: Diagnosis not present

## 2018-11-07 DIAGNOSIS — Z510 Encounter for antineoplastic radiation therapy: Secondary | ICD-10-CM | POA: Diagnosis not present

## 2018-11-08 ENCOUNTER — Ambulatory Visit
Admission: RE | Admit: 2018-11-08 | Discharge: 2018-11-08 | Disposition: A | Discharge status: Home / Self Care | Payer: Medicare Other | Source: Ambulatory Visit | Attending: Radiation Oncology | Admitting: Radiation Oncology

## 2018-11-08 DIAGNOSIS — C50312 Malignant neoplasm of lower-inner quadrant of left female breast: Secondary | ICD-10-CM | POA: Diagnosis not present

## 2018-11-08 DIAGNOSIS — Z510 Encounter for antineoplastic radiation therapy: Secondary | ICD-10-CM | POA: Diagnosis not present

## 2018-11-08 DIAGNOSIS — Z170 Estrogen receptor positive status [ER+]: Secondary | ICD-10-CM | POA: Diagnosis not present

## 2018-11-09 ENCOUNTER — Ambulatory Visit
Admission: RE | Admit: 2018-11-09 | Discharge: 2018-11-09 | Disposition: A | Discharge status: Home / Self Care | Payer: Medicare Other | Source: Ambulatory Visit | Attending: Radiation Oncology | Admitting: Radiation Oncology

## 2018-11-09 DIAGNOSIS — Z510 Encounter for antineoplastic radiation therapy: Secondary | ICD-10-CM | POA: Diagnosis not present

## 2018-11-09 DIAGNOSIS — Z170 Estrogen receptor positive status [ER+]: Secondary | ICD-10-CM | POA: Diagnosis not present

## 2018-11-09 DIAGNOSIS — C50312 Malignant neoplasm of lower-inner quadrant of left female breast: Secondary | ICD-10-CM | POA: Diagnosis not present

## 2018-11-10 ENCOUNTER — Ambulatory Visit
Admission: RE | Admit: 2018-11-10 | Discharge: 2018-11-10 | Disposition: A | Discharge status: Home / Self Care | Payer: Medicare Other | Source: Ambulatory Visit | Attending: Radiation Oncology | Admitting: Radiation Oncology

## 2018-11-10 DIAGNOSIS — Z170 Estrogen receptor positive status [ER+]: Secondary | ICD-10-CM | POA: Diagnosis not present

## 2018-11-10 DIAGNOSIS — Z510 Encounter for antineoplastic radiation therapy: Secondary | ICD-10-CM | POA: Diagnosis not present

## 2018-11-10 DIAGNOSIS — C50312 Malignant neoplasm of lower-inner quadrant of left female breast: Secondary | ICD-10-CM | POA: Diagnosis not present

## 2018-11-11 ENCOUNTER — Ambulatory Visit
Admission: RE | Admit: 2018-11-11 | Discharge: 2018-11-11 | Disposition: A | Discharge status: Home / Self Care | Payer: Medicare Other | Source: Ambulatory Visit | Attending: Radiation Oncology | Admitting: Radiation Oncology

## 2018-11-11 DIAGNOSIS — Z170 Estrogen receptor positive status [ER+]: Secondary | ICD-10-CM | POA: Diagnosis not present

## 2018-11-11 DIAGNOSIS — Z510 Encounter for antineoplastic radiation therapy: Secondary | ICD-10-CM | POA: Diagnosis not present

## 2018-11-11 DIAGNOSIS — C50312 Malignant neoplasm of lower-inner quadrant of left female breast: Secondary | ICD-10-CM | POA: Diagnosis not present

## 2018-11-14 ENCOUNTER — Ambulatory Visit
Admission: RE | Admit: 2018-11-14 | Discharge: 2018-11-14 | Disposition: A | Discharge status: Home / Self Care | Payer: Medicare Other | Source: Ambulatory Visit | Attending: Radiation Oncology | Admitting: Radiation Oncology

## 2018-11-14 DIAGNOSIS — Z170 Estrogen receptor positive status [ER+]: Secondary | ICD-10-CM | POA: Diagnosis not present

## 2018-11-14 DIAGNOSIS — C50312 Malignant neoplasm of lower-inner quadrant of left female breast: Secondary | ICD-10-CM | POA: Diagnosis not present

## 2018-11-14 DIAGNOSIS — Z510 Encounter for antineoplastic radiation therapy: Secondary | ICD-10-CM | POA: Diagnosis not present

## 2018-11-15 ENCOUNTER — Ambulatory Visit
Admission: RE | Admit: 2018-11-15 | Discharge: 2018-11-15 | Disposition: A | Discharge status: Home / Self Care | Payer: Medicare Other | Source: Ambulatory Visit | Attending: Radiation Oncology | Admitting: Radiation Oncology

## 2018-11-15 DIAGNOSIS — C50312 Malignant neoplasm of lower-inner quadrant of left female breast: Secondary | ICD-10-CM | POA: Diagnosis not present

## 2018-11-15 DIAGNOSIS — Z510 Encounter for antineoplastic radiation therapy: Secondary | ICD-10-CM | POA: Diagnosis not present

## 2018-11-15 DIAGNOSIS — Z170 Estrogen receptor positive status [ER+]: Secondary | ICD-10-CM | POA: Diagnosis not present

## 2018-11-16 ENCOUNTER — Ambulatory Visit
Admission: RE | Admit: 2018-11-16 | Discharge: 2018-11-16 | Disposition: A | Discharge status: Home / Self Care | Payer: Medicare Other | Source: Ambulatory Visit | Attending: Radiation Oncology | Admitting: Radiation Oncology

## 2018-11-16 DIAGNOSIS — Z510 Encounter for antineoplastic radiation therapy: Secondary | ICD-10-CM | POA: Diagnosis not present

## 2018-11-16 DIAGNOSIS — Z170 Estrogen receptor positive status [ER+]: Secondary | ICD-10-CM | POA: Diagnosis not present

## 2018-11-16 DIAGNOSIS — C50312 Malignant neoplasm of lower-inner quadrant of left female breast: Secondary | ICD-10-CM | POA: Diagnosis not present

## 2018-11-25 ENCOUNTER — Encounter: Payer: Self-pay | Admitting: Radiation Oncology

## 2018-11-25 NOTE — Progress Notes (Signed)
  Radiation Oncology         [phone]) [phone] ________________________________  Name: Norma Simon MRN: [id number]  Date: 11/25/2018  DOB: 1941/11/22  End of Treatment Note  Diagnosis:   Left-sided breast cancer     Indication for treatment:  Curative       Radiation treatment dates:   10/20/18 - 11/16/18  Site/dose:   The patient initially received a dose of 42.56 Gy in 16 fractions to the breast using whole-breast tangent fields. This was delivered using a 3-D conformal technique. The patient then received a boost to the seroma. This delivered an additional 8 Gy in 4 fractions using a 3 field photon technique due to the depth of the seroma. The total dose was 50.56 Gy.  Narrative: The patient tolerated radiation treatment relatively well. The patient had some expected skin irritation as she progressed during treatment. Desquamation was not present at the end of treatment. She experienced some hyperpigmentation changes and mild fatigue, but denied pain and any other concerns.  Plan: The patient has completed radiation treatment. The patient will return to radiation oncology clinic for routine followup in one month. I advised the patient to call or return sooner if they have any questions or concerns related to their recovery or treatment. ________________________________  Jodelle Gross, M.D., Ph.D.

## 2018-12-19 ENCOUNTER — Telehealth: Payer: Self-pay | Admitting: *Deleted

## 2018-12-19 NOTE — Telephone Encounter (Signed)
CALLED PATIENT TO INFORM OF FU WITH ALISON PERKINS ON 12-22-18 @ 1 PM, SPOKE WITH PATIENT'S WIFE RODGER AND HE WILL GIVE HER THE MESSAGE

## 2018-12-22 ENCOUNTER — Telehealth: Payer: Self-pay | Admitting: Radiation Oncology

## 2018-12-22 ENCOUNTER — Ambulatory Visit: Payer: Medicare Other | Admitting: Radiation Oncology

## 2018-12-22 NOTE — Telephone Encounter (Signed)
I called the patient at her request to review considerations since she completed her radiotherapy. She sounds to be doing better since completing treatment. She is dissappointed in the scheduling process and feels like she didn't receive a "personal touch." I asked her if she had reached out to navigation and she had not. I asked if she had other examples of how we could improve, and she said that she wished someone would have called her after her surgery. I will pass this along. Otherwise we reviewed the considerations of her skin over the long term regarding radiotherapy. She will come back as needed moving forward.

## 2019-01-09 DIAGNOSIS — I129 Hypertensive chronic kidney disease with stage 1 through stage 4 chronic kidney disease, or unspecified chronic kidney disease: Secondary | ICD-10-CM | POA: Diagnosis not present

## 2019-01-09 DIAGNOSIS — N183 Chronic kidney disease, stage 3 (moderate): Secondary | ICD-10-CM | POA: Diagnosis not present

## 2019-01-09 DIAGNOSIS — E785 Hyperlipidemia, unspecified: Secondary | ICD-10-CM | POA: Diagnosis not present

## 2019-01-09 DIAGNOSIS — Z0000 Encounter for general adult medical examination without abnormal findings: Secondary | ICD-10-CM | POA: Diagnosis not present

## 2019-01-18 ENCOUNTER — Encounter: Payer: Self-pay | Admitting: Hematology and Oncology

## 2019-01-18 DIAGNOSIS — Z853 Personal history of malignant neoplasm of breast: Secondary | ICD-10-CM | POA: Diagnosis not present

## 2019-01-25 ENCOUNTER — Other Ambulatory Visit: Payer: Self-pay | Admitting: Hematology and Oncology

## 2019-03-27 ENCOUNTER — Encounter: Payer: Self-pay | Admitting: Adult Health

## 2019-03-28 ENCOUNTER — Telehealth: Payer: Self-pay | Admitting: Adult Health

## 2019-03-28 NOTE — Telephone Encounter (Signed)
Called patient to see if she had received her packet, she noted she hadn't.  She voiced frustration because she had received several phone calls about this appointment and didn't feel like staff were communicating with one another.  I apologized, and requested she call us once she receives it.    Wilber Bihari, NP

## 2019-03-28 NOTE — Telephone Encounter (Signed)
Spoke with patient and she doesn't have wifi. She would like to be called on her home phone for her appointment tomorrow.

## 2019-03-29 ENCOUNTER — Inpatient Hospital Stay: Payer: Medicare Other | Admitting: Adult Health

## 2019-04-18 ENCOUNTER — Telehealth: Payer: Self-pay | Admitting: Adult Health

## 2019-04-18 NOTE — Telephone Encounter (Signed)
Spoke with Reynolds today re reconcling status of 4/8 SCP visit patients whose telephone visits could not take place due to not receiving packet. Per LC reached out to patient re rescheduling. Spoke with patient re telephone SCP visit 5/11 @ 8 am.

## 2019-05-01 ENCOUNTER — Inpatient Hospital Stay: Payer: Medicare Other | Attending: Adult Health | Admitting: Adult Health

## 2019-05-01 ENCOUNTER — Encounter: Payer: Self-pay | Admitting: Adult Health

## 2019-05-01 DIAGNOSIS — C50312 Malignant neoplasm of lower-inner quadrant of left female breast: Secondary | ICD-10-CM | POA: Diagnosis not present

## 2019-05-01 DIAGNOSIS — Z170 Estrogen receptor positive status [ER+]: Secondary | ICD-10-CM | POA: Diagnosis not present

## 2019-05-01 NOTE — Progress Notes (Signed)
SURVIVORSHIP VIRTUAL VISIT:  I connected with Norma Simon on 05/01/19 at  8:00 AM EDT by telephone and verified that I am speaking with the correct person using two identifiers.   I discussed the limitations, risks, security and privacy concerns of performing an evaluation and management service by telephone and the availability of in person appointments. I also discussed with the patient that there may be a patient responsible charge related to this service. The patient expressed understanding and agreed to proceed.     REASON FOR VISIT:  Routine follow-up for history of breast cancer.   BRIEF ONCOLOGIC HISTORY:    Malignant neoplasm of lower-inner quadrant of left breast in female, estrogen receptor positive ([place])   01/10/2018 Initial Diagnosis    Screening detected left breast mass ill-defined LIQ 3 cm at 8 o'clock position axilla negative, ultrasound biopsy: Invasive lobular cancer grade 1-2, ER 90%, PR 100%, Ki-67 10%, HER-2 negative, T2N0 stage Ib clinical stage AJCC 8    02/08/2018 -  Anti-estrogen oral therapy    Letrozole Neoadj therapy    08/31/2018 Cancer Staging    Staging form: Breast, AJCC 8th Edition - Pathologic: No Stage Recommended (ypT54m, pN0, cM0, G1, ER+, PR+, HER2-) - Signed by CGardenia Phlegm NP on 09/28/2018    09/13/2018 Surgery    Left lumpectomy: Invasive lobular cancer, grade 1, microscopic focus, ALH, 0/2 lymph nodes negative, ER 90%, PR 100%, HER-2 negative, Ki-67 10%, ypTmic, ypN0.  Near complete response    10/20/2018 - 11/16/2018 Radiation Therapy     The patient initially received a dose of 42.56 Gy in 16 fractions to the breast using whole-breast tangent fields. This was delivered using a 3-D conformal technique. The patient then received a boost to the seroma. This delivered an additional 8 Gy in 4 fractions using a 3 field photon technique due to the depth of the seroma. The total dose was 50.56 Gy.       INTERVAL HISTORY:  Norma Simon presents to the Survivorship Clinic today for routine follow-up for her history of breast cancer.  Overall, she reports feeling quite well. She had some concerns about her breast cancer experience particularly during the CEarlypandemic surrounding communication from our staff.  She shared those with me at the closure of our discussion, as ideas for improvement for future patients.    KEmberlihas noted no issues from her previously irradiated breast.  She is taking Letrozole daily and is tolerating it quite well. She has not noted hot flashes, vaginal dryness, arthrlagias.  She is unsure when her last bone density was.  She notes that her mammogram was in 12/2018 and her next one is scheduled in early February, 2020. She is feeling quite well otherwise.     REVIEW OF SYSTEMS:  Review of Systems  Constitutional: Negative for appetite change, chills, fatigue, fever and unexpected weight change.  HENT:   Negative for hearing loss, lump/mass, nosebleeds, sore throat and trouble swallowing.   Eyes: Negative for eye problems and icterus.  Respiratory: Negative for chest tightness, cough and shortness of breath.   Cardiovascular: Negative for chest pain, leg swelling and palpitations.  Gastrointestinal: Negative for abdominal distention, abdominal pain, constipation, diarrhea, nausea and vomiting.  Endocrine: Negative for hot flashes.  Skin: Negative for itching and rash.  Neurological: Negative for dizziness, extremity weakness, headaches, light-headedness and speech difficulty.  Hematological: Negative for adenopathy. Does not bruise/bleed easily.  Psychiatric/Behavioral: Negative for depression. The patient is not nervous/anxious.   Breast:  Denies any new nodularity, masses, tenderness, nipple changes, or nipple discharge.       PAST MEDICAL/SURGICAL HISTORY:  Past Medical History:  Diagnosis Date  . Cancer ([place]) 04/2018   left breast cancer  . Complication of anesthesia   . Hyperlipemia    . Hypertension   . PONV (postoperative nausea and vomiting)    Past Surgical History:  Procedure Laterality Date  . BREAST LUMPECTOMY WITH RADIOACTIVE SEED AND SENTINEL LYMPH NODE BIOPSY Left 09/13/2018   Procedure: BREAST LUMPECTOMY WITH RADIOACTIVE SEED AND SENTINEL LYMPH NODE BIOPSY;  Surgeon: Rolm Bookbinder, MD;  Location: [facility];  Service: General;  Laterality: Left;  . WRIST SURGERY       ALLERGIES:  No Known Allergies   CURRENT MEDICATIONS:  Outpatient Encounter Medications as of 05/01/2019  Medication Sig  . amLODipine (NORVASC) 5 MG tablet Take 2.5 mg by mouth daily.   Marland Kitchen aspirin EC 81 MG tablet Take 81 mg by mouth daily.  . benazepril-hydrochlorthiazide (LOTENSIN HCT) 20-25 MG per tablet Take 1 tablet by mouth daily.  . calcium carbonate (OS-CAL) 600 MG TABS Take 600 mg by mouth daily.  . cholecalciferol (VITAMIN D) 1000 UNITS tablet Take 1,000 Units by mouth daily.  Marland Kitchen letrozole (FEMARA) 2.5 MG tablet TAKE 1 TABLET(2.5 MG) BY MOUTH DAILY  . Multiple Vitamin (MULTIVITAMIN WITH MINERALS) TABS Take 1 tablet by mouth daily.  Marland Kitchen oxyCODONE (OXY IR/ROXICODONE) 5 MG immediate release tablet Take 1 tablet (5 mg total) by mouth every 6 (six) hours as needed for moderate pain, severe pain or breakthrough pain. (Patient not taking: Reported on 10/13/2018)  . pravastatin (PRAVACHOL) 40 MG tablet Take 40 mg by mouth every evening.  . vitamin E 400 UNIT capsule Take 400 Units by mouth daily.   No facility-administered encounter medications on file as of 05/01/2019.      ONCOLOGIC FAMILY HISTORY:  Family History  Problem Relation Age of Onset  . Breast cancer Mother   . Lung cancer Father     GENETIC COUNSELING/TESTING: Not at this time  SOCIAL HISTORY:  Social History   Socioeconomic History  . Marital status: Married    Spouse name: Not on file  . Number of children: Not on file  . Years of education: Not on file  . Highest education level: Not on  file  Occupational History  . Not on file  Social Needs  . Financial resource strain: Not on file  . Food insecurity:    Worry: Not on file    Inability: Not on file  . Transportation needs:    Medical: No    Non-medical: No  Tobacco Use  . Smoking status: Never Smoker  . Smokeless tobacco: Never Used  Substance and Sexual Activity  . Alcohol use: No  . Drug use: No  . Sexual activity: Not on file  Lifestyle  . Physical activity:    Days per week: Not on file    Minutes per session: Not on file  . Stress: Not on file  Relationships  . Social connections:    Talks on phone: Not on file    Gets together: Not on file    Attends religious service: Not on file    Active member of club or organization: Not on file    Attends meetings of clubs or organizations: Not on file    Relationship status: Not on file  . Intimate partner violence:    Fear of current or ex partner: No  Emotionally abused: No    Physically abused: No    Forced sexual activity: No  Other Topics Concern  . Not on file  Social History Narrative  . Not on file      OBJECTIVE:  Patient in no apparent distress, slightly aggressive tone at beginning of call, but resolved as discussion continued.  Behavior and mood were normal.  Breathing was non labored.    LABORATORY DATA:  None for this visit   DIAGNOSTIC IMAGING:  Most recent mammogram: with [facility], requested by Thompson Caul, LPN    ASSESSMENT AND PLAN:  Ms.. Simon is a pleasant 78 y.o. female with history of Stage IB left breast invasive ductal carcinoma, ER+/PR+/HER2-, diagnosed in 12/2017, treated with Letrozole, lumpectomy, and adjuvant radiation therapy.  She presents to the Survivorship Clinic for surveillance and routine follow-up.   1. History of breast cancer:  Norma Simon is currently clinically and radiographically without evidence of disease or recurrence of breast cancer. She will be due for mammogram in 01/2019.  She will continue her  anti-estrogen therapy with Letrozole, with plans to continue for 5-7 years.  She will return to the cancer center to see her medical oncologist, Dr. Lindi Adie in three months time.  I encouraged her to call me with any questions or concerns before her next visit at the cancer center, and I would be happy to see her sooner, if needed.    2.  Concerns: Norma Simon and I discussed some opportunities for improvement in her care and communication of her appointments.  I took a list of her concerns and will follow up with the appropriate contacts--there were no concerns regarding the quality of her care, so I will not detail them in her medical record.    3. Bone health:  Given Norma Simon's age, history of breast cancer, and her current anti-estrogen therapy with Letrozole, she is at risk for bone demineralization. She cannot recall when her last bone density was, and declines scheduling one at this point.  She says she will let Dr. Lindi Adie know about this at her appointment with him in three months.  She was given education on specific food and activities to promote bone health.  4. Cancer screening:  Due to Norma Simon history and her age, she should receive screening for skin cancers. She was encouraged to follow-up with her PCP for appropriate cancer screenings.   5. Health maintenance and wellness promotion: Norma Simon was encouraged to consume 5-7 servings of fruits and vegetables per day. She was also encouraged to engage in moderate to vigorous exercise for 30 minutes per day most days of the week. She was instructed to limit her alcohol consumption and continue to abstain from tobacco use.    Follow up instructions:    -Return to cancer center in three months for f/u with Dr. Lindi Adie  -Mammogram due in 01/2020 -Follow up with surgery per Dr. Emmie Niemann   The patient was provided an opportunity to ask questions and all were answered. The patient agreed with the plan and demonstrated an understanding of the  instructions.   The patient was advised to call back or seek an in-person evaluation if the symptoms worsen or if the condition fails to improve as anticipated.   I provided 27 minutes of non face-to-face telephone visit time during this encounter, and > 50% was spent counseling as documented under my assessment & plan.   Gardenia Phlegm, NP Survivorship Program [place] [phone]   Note:  PRIMARY CARE PROVIDER Harlan Stains, [place] [phone]

## 2019-05-02 ENCOUNTER — Telehealth: Payer: Self-pay | Admitting: Hematology and Oncology

## 2019-05-02 ENCOUNTER — Encounter: Payer: Medicare Other | Admitting: Adult Health

## 2019-05-02 NOTE — Telephone Encounter (Signed)
Tried to reach regarding schedule °

## 2019-06-26 DIAGNOSIS — N183 Chronic kidney disease, stage 3 (moderate): Secondary | ICD-10-CM | POA: Diagnosis not present

## 2019-06-26 DIAGNOSIS — I129 Hypertensive chronic kidney disease with stage 1 through stage 4 chronic kidney disease, or unspecified chronic kidney disease: Secondary | ICD-10-CM | POA: Diagnosis not present

## 2019-06-26 DIAGNOSIS — E785 Hyperlipidemia, unspecified: Secondary | ICD-10-CM | POA: Diagnosis not present

## 2019-06-26 DIAGNOSIS — C50312 Malignant neoplasm of lower-inner quadrant of left female breast: Secondary | ICD-10-CM | POA: Diagnosis not present

## 2019-07-26 ENCOUNTER — Telehealth: Payer: Self-pay | Admitting: Hematology and Oncology

## 2019-07-26 NOTE — Telephone Encounter (Signed)
I talk with patient regarding schedule  

## 2019-07-26 NOTE — Assessment & Plan Note (Signed)
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   09/13/2018:Left lumpectomy: Invasive lobular cancer, grade 1, microscopic focus, ALH, 0/2 lymph nodes negative, ER 90%, PR 100%, HER-2 negative, Ki-67 10%, ypTmic, ypN0.  Near complete response  Current treatment: Adjuvant antiestrogen therapy with letrozole 2.5 mg daily started 09/20/2018 Letrozole toxicities:  Breast cancer surveillance: 01/18/2019: Benign, breast density category B  Return to clinic in 1 year for follow-up

## 2019-08-01 ENCOUNTER — Ambulatory Visit: Payer: Medicare Other | Admitting: Hematology and Oncology

## 2019-08-01 NOTE — Progress Notes (Signed)
HEMATOLOGY-ONCOLOGY MYCHART VIDEO VISIT PROGRESS NOTE  I connected with Norma Simon on 08/02/2019 at  8:15 AM EDT by MyChart video conference and verified that I am speaking with the correct person using two identifiers.  I discussed the limitations, risks, security and privacy concerns of performing an evaluation and management service by MyChart and the availability of in person appointments.  I also discussed with the patient that there may be a patient responsible charge related to this service. The patient expressed understanding and agreed to proceed.  Patient's Location: Home Physician Location: Clinic  CHIEF COMPLIANT: Follow-up of left breast cancer on letrozole   INTERVAL HISTORY: Norma Simon is a 78 y.o. female with above-mentioned history of left breast cancer treated with lumpectomy, radiation, and who is currently on anti-estrogen therapy with letrozole. I last saw her 10 months ago and in the interim she was seen by Wilber Bihari, NP for Survivorship Clinic. Mammogram on 01/18/19 showed no evidence of malignancy bilaterally. She presents over MyChart today for follow-up.   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  Near complete response   10/20/2018 - 11/16/2018 Radiation Therapy    The patient initially received a dose of 42.56 Gy in 16 fractions to the breast using whole-breast tangent fields. This was delivered using a 3-D conformal technique. The patient then received a boost to the seroma. This delivered an additional 8 Gy in 4 fractions using a 3 field photon technique due to the depth of the seroma. The total dose was 50.56 Gy.     REVIEW OF SYSTEMS:   Constitutional: Denies fevers, chills or abnormal weight loss Eyes: Denies blurriness of vision Ears, nose, mouth, throat, and face: Denies mucositis or sore throat Respiratory: Denies cough, dyspnea or wheezes Cardiovascular: Denies palpitation, chest discomfort Gastrointestinal:  Denies nausea, heartburn or change in bowel habits Skin: Denies abnormal skin rashes Lymphatics: Denies new lymphadenopathy or easy bruising Neurological:Denies numbness, tingling or new weaknesses Behavioral/Psych: Mood is stable, no new changes  Extremities: No lower extremity edema Breast: denies any pain or lumps or nodules in either breasts All other systems were reviewed with the patient and are negative.  Observations/Objective:  There were no vitals filed for this visit. There is no height or weight on file to calculate BMI.  I have reviewed the data as listed CMP Latest Ref Rng & Units 08/15/2018 01/26/2018 10/24/2012  Glucose 70 - 99 mg/dL 103(H) 131 106(H)  BUN 8 - 23 mg/dL '19 20 17  '$ Creatinine 0.44 - 1.00 mg/dL 0.95 0.97 0.87  Sodium 135 - 145 mmol/L 141 142 139  Potassium 3.5 - 5.1 mmol/L 3.6 3.5 3.6  Chloride 98 - 111 mmol/L 103 105 101  CO2 22 - 32 mmol/L 28 27  27  Calcium 8.9 - 10.3 mg/dL 9.7 9.5 9.5  Total Protein 6.5 - 8.1 g/dL 7.5 7.3 -  Total Bilirubin 0.3 - 1.2 mg/dL 0.5 0.5 -  Alkaline Phos 38 - 126 U/L 53 48 -  AST 15 - 41 U/L 14(L) 15 -  ALT 0 - 44 U/L 13 14 -    Lab Results  Component Value Date   WBC 6.4  08/15/2018   HGB 14.1 08/15/2018   HCT 42.1 08/15/2018   MCV 89.2 08/15/2018   PLT 236 08/15/2018   NEUTROABS 4.3 08/15/2018      Assessment Plan:  Malignant neoplasm of lower-inner quadrant of left breast in female, estrogen receptor positive ([place]) 01/10/2018:Screening detected left breast mass ill-defined LIQ 3 cm at 8 o'clock position axilla negative, ultrasound biopsy: Invasive lobular cancer grade 1-2, ER 90%, PR 100%, Ki-67 10%, HER-2 negative, T2N0 stage Ib clinical stage AJCC 8  MRI Breast 02/01/18: Mass and NME extends 4.3 X 2.1 X 2.2 cm, No abnormal LN 09/13/2018:Left lumpectomy: Invasive lobular cancer, grade 1, microscopic focus, ALH, 0/2 lymph nodes negative, ER 90%, PR 100%, HER-2 negative, Ki-67 10%, ypTmic, ypN0.  Near complete response  Current treatment: Adjuvant antiestrogen therapy with letrozole 2.5 mg daily started Feb 2019 (even before surgery) LetrozoleToxicities: Denies any hot flashes or myalgias.  Breast cancer surveillance:Mammogram 01/18/2019: Benign, breast density category B  Return to clinic in 1 year for follow-up  I discussed the assessment and treatment plan with the patient. The patient was provided an opportunity to ask questions and all were answered. The patient agreed with the plan and demonstrated an understanding of the instructions. The patient was advised to call back or seek an in-person evaluation if the symptoms worsen or if the condition fails to improve as anticipated.   I provided 15 minutes of face-to-face MyChart video visit time during this encounter.    Rulon Eisenmenger, MD 08/02/2019   I, Molly Dorshimer, am acting as scribe for Nicholas Lose, MD.  I have reviewed the above documentation for accuracy and completeness, and I agree with the above.

## 2019-08-02 ENCOUNTER — Inpatient Hospital Stay: Payer: Medicare Other | Attending: Adult Health | Admitting: Hematology and Oncology

## 2019-08-02 DIAGNOSIS — Z923 Personal history of irradiation: Secondary | ICD-10-CM

## 2019-08-02 DIAGNOSIS — Z79811 Long term (current) use of aromatase inhibitors: Secondary | ICD-10-CM | POA: Diagnosis not present

## 2019-08-02 DIAGNOSIS — C50312 Malignant neoplasm of lower-inner quadrant of left female breast: Secondary | ICD-10-CM | POA: Diagnosis not present

## 2019-08-02 DIAGNOSIS — Z170 Estrogen receptor positive status [ER+]: Secondary | ICD-10-CM

## 2019-08-02 MED ORDER — LETROZOLE 2.5 MG PO TABS: 2.5000 mg | ORAL_TABLET | Freq: Every day | ORAL | 3 refills | Status: DC

## 2019-08-03 ENCOUNTER — Telehealth: Payer: Self-pay | Admitting: Hematology and Oncology

## 2019-08-03 NOTE — Telephone Encounter (Signed)
I talk with patient regarding schedule  

## 2019-10-06 DIAGNOSIS — Z23 Encounter for immunization: Secondary | ICD-10-CM | POA: Diagnosis not present

## 2019-10-12 ENCOUNTER — Other Ambulatory Visit: Payer: Self-pay | Admitting: Hematology and Oncology

## 2020-01-10 ENCOUNTER — Other Ambulatory Visit: Payer: Self-pay | Admitting: Hematology and Oncology

## 2020-01-18 DIAGNOSIS — N183 Chronic kidney disease, stage 3 unspecified: Secondary | ICD-10-CM | POA: Diagnosis not present

## 2020-01-18 DIAGNOSIS — I129 Hypertensive chronic kidney disease with stage 1 through stage 4 chronic kidney disease, or unspecified chronic kidney disease: Secondary | ICD-10-CM | POA: Diagnosis not present

## 2020-01-18 DIAGNOSIS — Z0000 Encounter for general adult medical examination without abnormal findings: Secondary | ICD-10-CM | POA: Diagnosis not present

## 2020-01-18 DIAGNOSIS — M8588 Other specified disorders of bone density and structure, other site: Secondary | ICD-10-CM | POA: Diagnosis not present

## 2020-01-24 DIAGNOSIS — R928 Other abnormal and inconclusive findings on diagnostic imaging of breast: Secondary | ICD-10-CM | POA: Diagnosis not present

## 2020-02-02 DIAGNOSIS — R7303 Prediabetes: Secondary | ICD-10-CM | POA: Diagnosis not present

## 2020-02-02 DIAGNOSIS — E559 Vitamin D deficiency, unspecified: Secondary | ICD-10-CM | POA: Diagnosis not present

## 2020-02-02 DIAGNOSIS — N183 Chronic kidney disease, stage 3 unspecified: Secondary | ICD-10-CM | POA: Diagnosis not present

## 2020-02-02 DIAGNOSIS — I129 Hypertensive chronic kidney disease with stage 1 through stage 4 chronic kidney disease, or unspecified chronic kidney disease: Secondary | ICD-10-CM | POA: Diagnosis not present

## 2020-02-11 ENCOUNTER — Ambulatory Visit: Payer: Medicare Other | Attending: Internal Medicine

## 2020-02-11 DIAGNOSIS — Z23 Encounter for immunization: Secondary | ICD-10-CM | POA: Insufficient documentation

## 2020-02-11 NOTE — Progress Notes (Signed)
   Covid-19 Vaccination Clinic  Name:  Norma Simon    MRN: [id number] DOB: [birth year]  02/11/2020  [name] was observed post Covid-19 immunization for 15 minutes without incidence. She was provided with Vaccine Information Sheet and instruction to access the V-Safe system.   [name] was instructed to call 911 with any severe reactions post vaccine: Marland Kitchen Difficulty breathing  . Swelling of your face and throat  . A fast heartbeat  . A bad rash all over your body  . Dizziness and weakness    Immunizations Administered    Name Date Dose VIS Date Route   Pfizer COVID-19 Vaccine 02/11/2020 10:06 AM 0.3 mL 12/01/2019 Intramuscular   Manufacturer: [place]   Lot: [id number]   [place]: [id number]

## 2020-03-06 ENCOUNTER — Ambulatory Visit: Payer: Medicare Other | Attending: Internal Medicine

## 2020-03-06 DIAGNOSIS — Z23 Encounter for immunization: Secondary | ICD-10-CM

## 2020-03-06 NOTE — Progress Notes (Signed)
   Covid-19 Vaccination Clinic  Name:  Norma Simon    MRN: [id number] DOB: [birth year]  03/06/2020  [name] was observed post Covid-19 immunization for 15 minutes without incident. She was provided with Vaccine Information Sheet and instruction to access the V-Safe system.   [name] was instructed to call 911 with any severe reactions post vaccine: Marland Kitchen Difficulty breathing  . Swelling of face and throat  . A fast heartbeat  . A bad rash all over body  . Dizziness and weakness   Immunizations Administered    Name Date Dose VIS Date Route   Pfizer COVID-19 Vaccine 03/06/2020  8:28 AM 0.3 mL 12/01/2019 Intramuscular   Manufacturer: [place]   Lot: [id number]   [place]: [id number]

## 2020-04-01 DIAGNOSIS — E785 Hyperlipidemia, unspecified: Secondary | ICD-10-CM | POA: Diagnosis not present

## 2020-04-01 DIAGNOSIS — Z79899 Other long term (current) drug therapy: Secondary | ICD-10-CM | POA: Diagnosis not present

## 2020-06-27 IMAGING — MR MR BILATERAL BREAST WITHOUT AND WITH CONTRAST
6 of 13 series · 19 of 48 positions shown · IV contrast (multihance)
Comparison: Breast MRI dated 02/01/2018.

CLINICAL DATA: Status post neoadjuvant chemotherapy for LEFT breast
cancer. Follow-up exam.

LABS:  Not applicable
EXAM:
BILATERAL BREAST MRI WITH AND WITHOUT CONTRAST
TECHNIQUE: Multiplanar, multisequence MR images of both breasts were obtained
prior to and following the intravenous administration of 13 ml of
MultiHance.
Three-dimensional MR images were rendered by post-processing the
original MR data using the DynaCAD thin client. The 3D MR images are
interpreted and the findings are included in the complete MRI report
below.

[Series 1: 3 plane loc · axial · 7.0mm · 1.56mm/px · 1 of 25 slices shown]
[im 1/25]
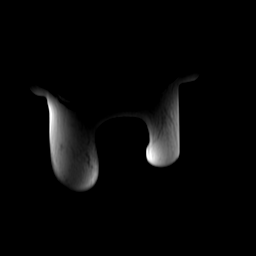

[Series 4: ax ir · axial · 3.0mm · 0.70mm/px · 1 of 83 slices shown]
[im 1/83]
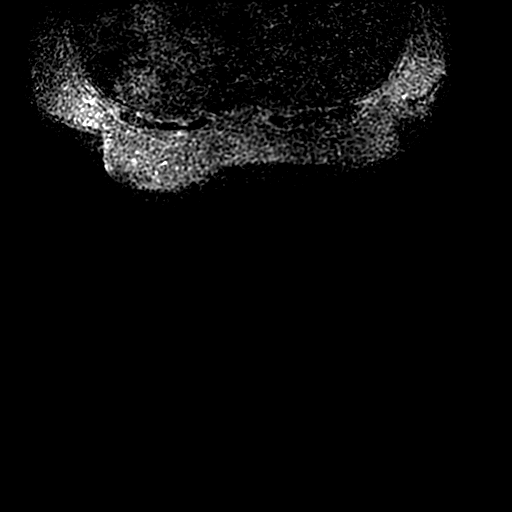

[Series 600: vibrant mph +c · axial · 2.0mm · 0.62mm/px · z∈[-161,+86]mm · 5 of 248 slices shown]
[im 1/248]
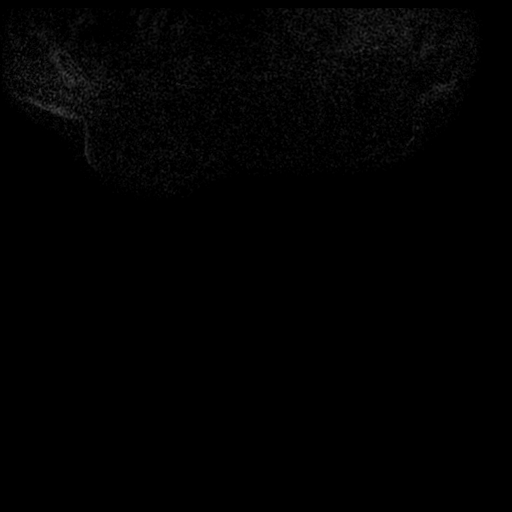
[im 62/248]
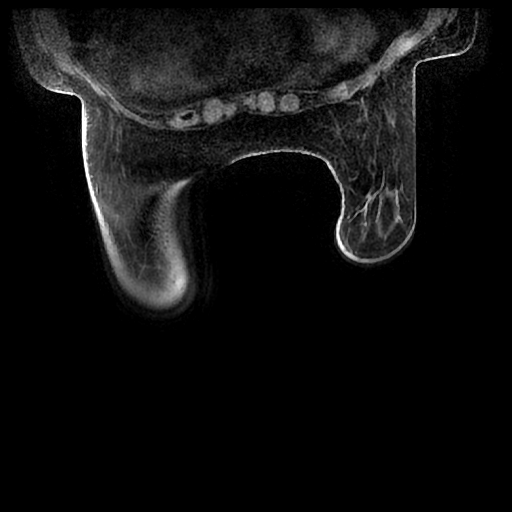
[im 124/248]
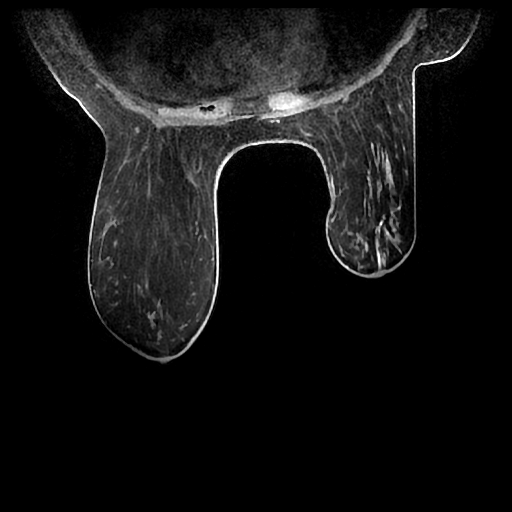
[im 186/248]
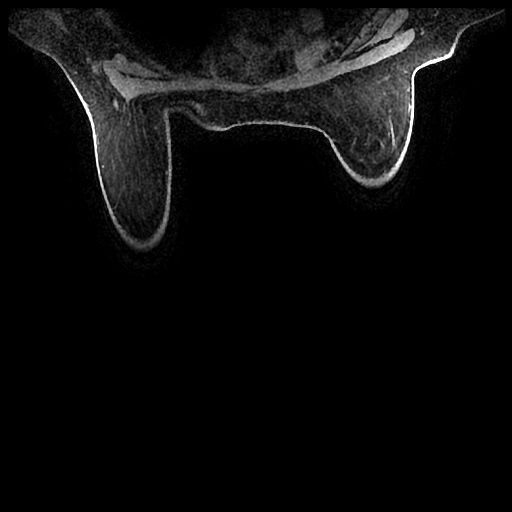
[im 248/248]
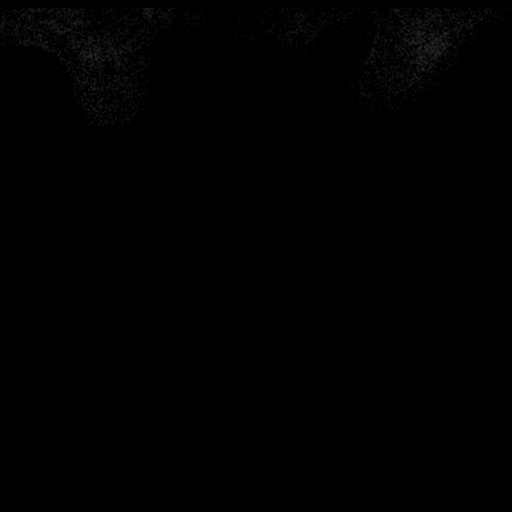

[Series 601: ph1/ vibrant mph · axial · 2.0mm · 0.62mm/px · z∈[-161,+86]mm · 5 of 248 slices shown]
[im 1/248]
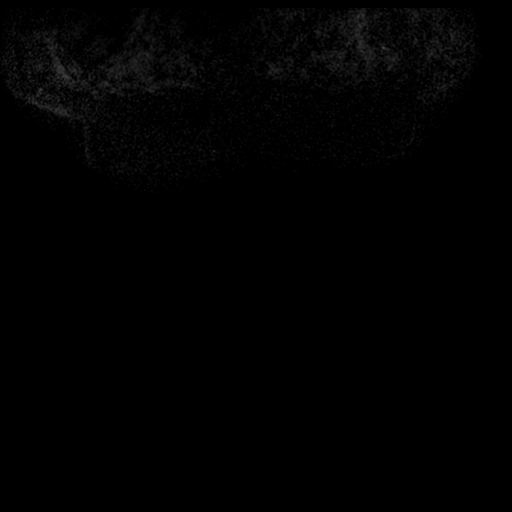
[im 62/248]
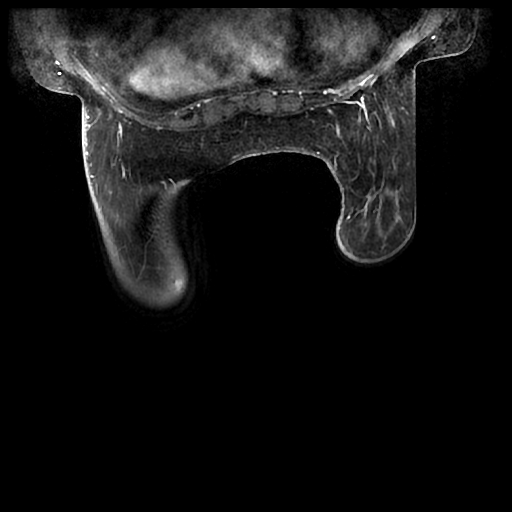
[im 124/248]
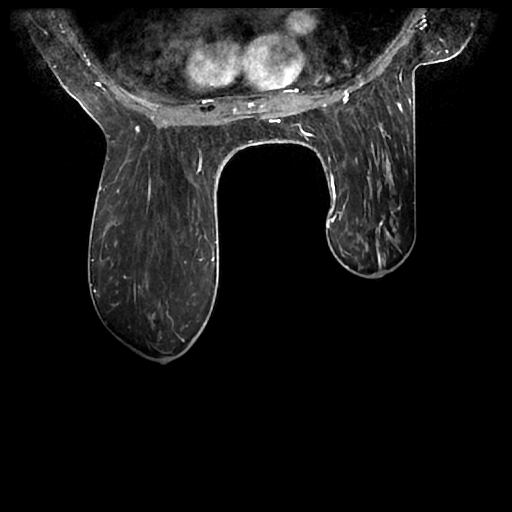
[im 186/248]
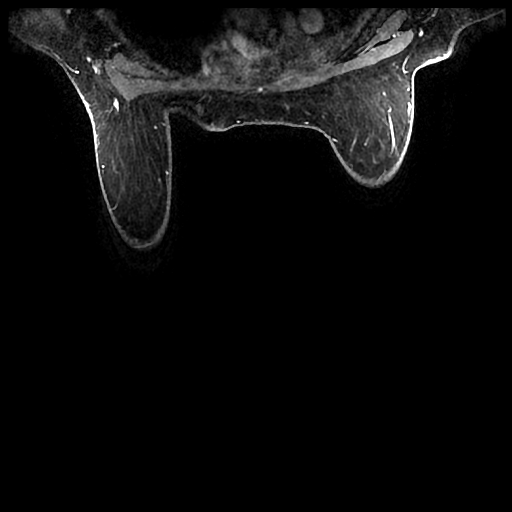
[im 248/248]
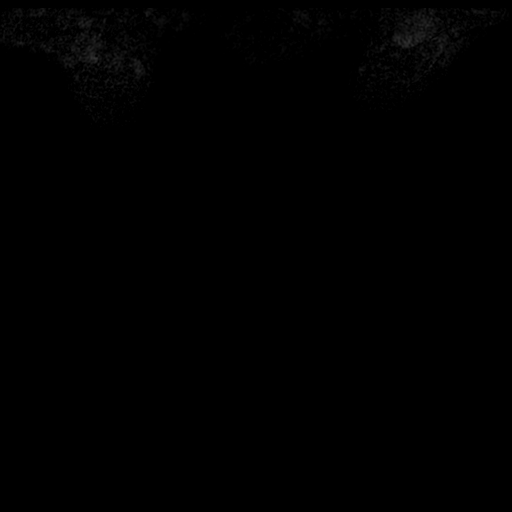

[Series 602: ph2/ vibrant mph · axial · 2.0mm · 0.62mm/px · z∈[-161,+86]mm · 5 of 248 slices shown]
[im 1/248]
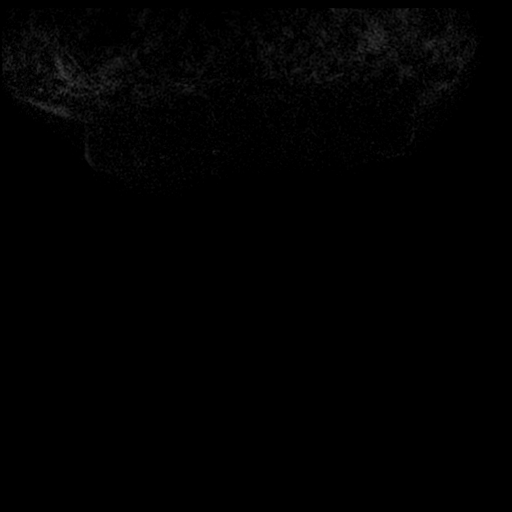
[im 62/248]
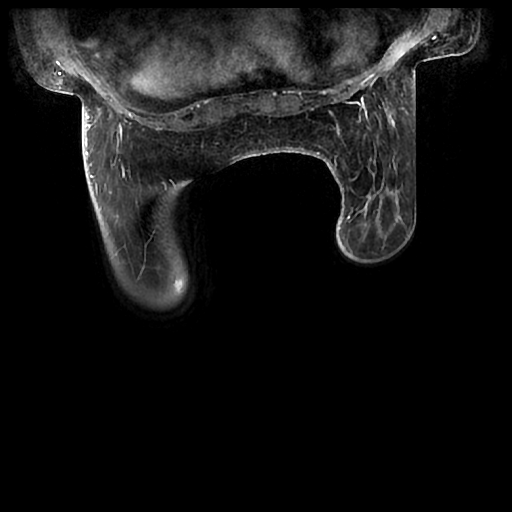
[im 124/248]
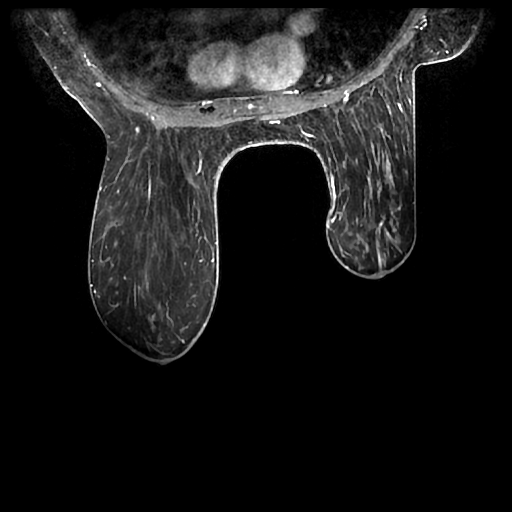
[im 186/248]
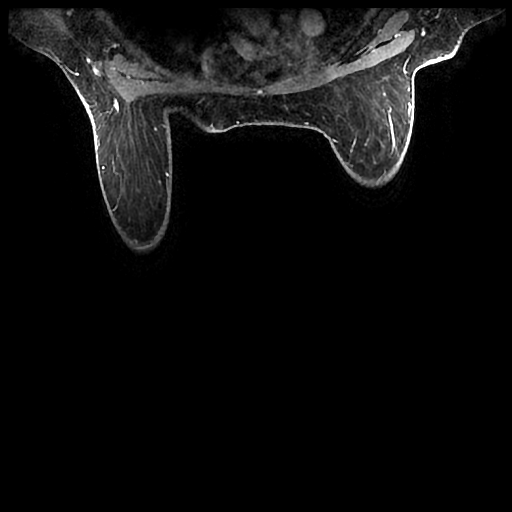
[im 248/248]
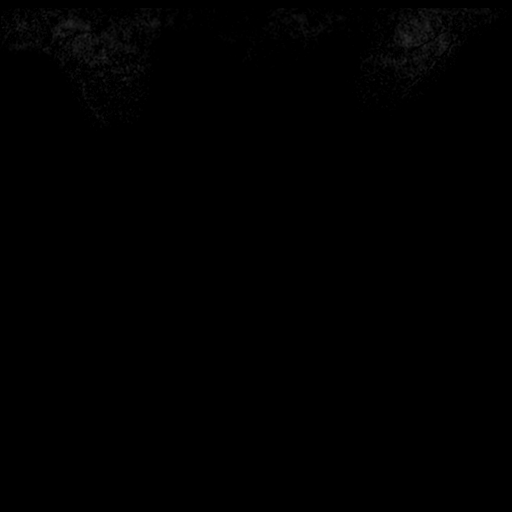

[Series 603: ph3/ vibrant mph · axial · 2.0mm · 0.62mm/px · z∈[-161,-100]mm · 2 of 248 slices shown]
[im 1/248]
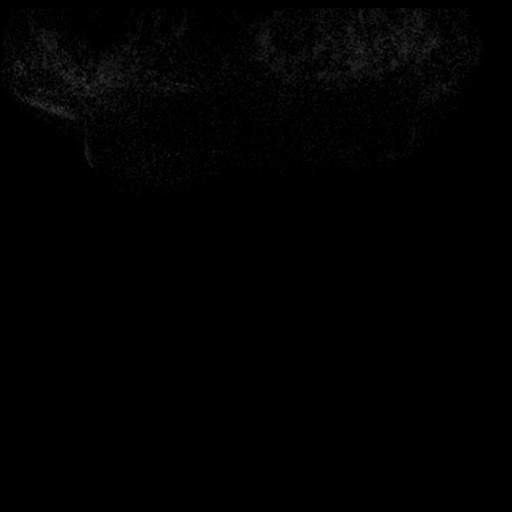
[im 62/248]
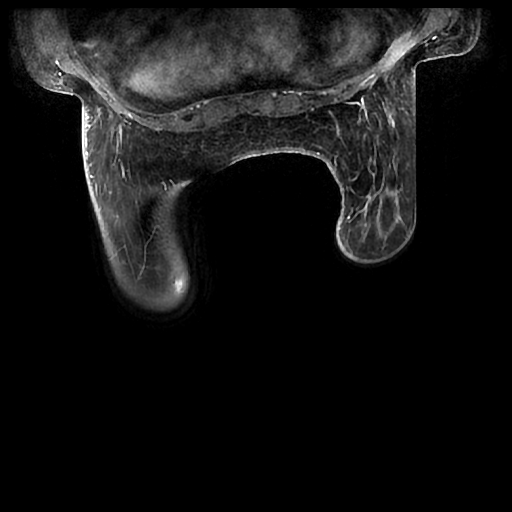

[19 of 48 positions shown; findings below may reference images not displayed]

FINDINGS: Breast composition: b. Scattered fibroglandular tissue.

Background parenchymal enhancement: Mild

Right breast: No mass or abnormal enhancement.

Left breast: There has been near complete resolution of the clumped
non-mass enhancement within the lower inner quadrant of the LEFT
breast, with only minimal subtle residual enhancement along the
posterior margin of the biopsy clip, corresponding to the site of
biopsy-proven carcinoma.

Lymph nodes: No abnormal appearing lymph nodes.

Ancillary findings:  Probable cardiomegaly.
IMPRESSION: 1. Near complete resolution of the clumped non-mass enhancement
within the lower inner quadrant of the LEFT breast, with only
minimal subtle residual enhancement at the site of the biopsy clip,
indicating very good response to interval neoadjuvant therapy.
2. No evidence of malignancy within the RIGHT breast.
3. Probable cardiomegaly. Recommend correlation with chest x-ray.
Previous chest x-ray report of 10/24/2012 described cardiac shadow
at upper limits of normal.

RECOMMENDATION:
1. Per current treatment plan for patient's known LEFT breast
cancer.
2. Chest x-ray to more definitively characterize heart size.

BI-RADS CATEGORY  6: Known biopsy-proven malignancy.

## 2020-07-18 DIAGNOSIS — R7303 Prediabetes: Secondary | ICD-10-CM | POA: Diagnosis not present

## 2020-07-18 DIAGNOSIS — N183 Chronic kidney disease, stage 3 unspecified: Secondary | ICD-10-CM | POA: Diagnosis not present

## 2020-07-18 DIAGNOSIS — I129 Hypertensive chronic kidney disease with stage 1 through stage 4 chronic kidney disease, or unspecified chronic kidney disease: Secondary | ICD-10-CM | POA: Diagnosis not present

## 2020-07-18 DIAGNOSIS — E785 Hyperlipidemia, unspecified: Secondary | ICD-10-CM | POA: Diagnosis not present

## 2020-07-19 ENCOUNTER — Telehealth: Payer: Self-pay | Admitting: Hematology and Oncology

## 2020-07-19 NOTE — Telephone Encounter (Signed)
Rescheduled appointment per 7/29 scheduling message and patient request. Left message on patient's voicemail with updated appointment date and time.

## 2020-08-01 ENCOUNTER — Ambulatory Visit: Payer: Medicare Other | Admitting: Hematology and Oncology

## 2020-08-08 ENCOUNTER — Ambulatory Visit: Payer: Medicare Other | Admitting: Hematology and Oncology

## 2020-08-11 NOTE — Progress Notes (Signed)
Patient Care Team: Harlan Stains, MD as PCP - General (Family Medicine) Nicholas Lose, MD as Consulting Physician (Hematology and Oncology) Rolm Bookbinder, MD as Consulting Physician (General Surgery) Kyung Rudd, MD as Consulting Physician (Radiation Oncology)  DIAGNOSIS:    ICD-10-CM   1. Malignant neoplasm of lower-inner quadrant of left breast in female, estrogen receptor positive ([place])  C50.312    Z17.0     SUMMARY OF ONCOLOGIC HISTORY: Oncology History  Malignant neoplasm of lower-inner quadrant of left breast in female, estrogen receptor positive ([place])  01/10/2018 Initial Diagnosis   Screening detected left breast mass ill-defined LIQ 3 cm at 8 o'clock position axilla negative, ultrasound biopsy: Invasive lobular cancer grade 1-2, ER 90%, PR 100%, Ki-67 10%, HER-2 negative, T2N0 stage Ib clinical stage AJCC 8   02/08/2018 -  Anti-estrogen oral therapy   Letrozole Neoadj therapy   08/31/2018 Cancer Staging   Staging form: Breast, AJCC 8th Edition - Pathologic: No Stage Recommended (ypT58mi, pN0, cM0, G1, ER+, PR+, HER2-) - Signed by Gardenia Phlegm, NP on 09/28/2018   09/13/2018 Surgery   Left lumpectomy: Invasive lobular cancer, grade 1, microscopic focus, ALH, 0/2 lymph nodes negative, ER 90%, PR 100%, HER-2 negative, Ki-67 10%, ypTmic, ypN0.  Near complete response   10/20/2018 - 11/16/2018 Radiation Therapy    The patient initially received a dose of 42.56 Gy in 16 fractions to the breast using whole-breast tangent fields. This was delivered using a 3-D conformal technique. The patient then received a boost to the seroma. This delivered an additional 8 Gy in 4 fractions using a 3 field photon technique due to the depth of the seroma. The total dose was 50.56 Gy.     CHIEF COMPLIANT: Follow-up of left breast cancer on letrozole   INTERVAL HISTORY: Norma Simon is a 79 y.o. with above-mentioned history of left breast cancer treated with lumpectomy, radiation,  and who is currently on anti-estrogen therapy with letrozole. She presents to the clinic today for follow-up.    ALLERGIES:  has No Known Allergies.  MEDICATIONS:  Current Outpatient Medications  Medication Sig Dispense Refill  . amLODipine (NORVASC) 5 MG tablet Take 2.5 mg by mouth daily.     . benazepril-hydrochlorthiazide (LOTENSIN HCT) 20-25 MG per tablet Take 1 tablet by mouth daily.    . calcium carbonate (OS-CAL) 600 MG TABS Take 600 mg by mouth daily.    . cholecalciferol (VITAMIN D) 1000 UNITS tablet Take 1,000 Units by mouth daily.    Marland Kitchen letrozole (FEMARA) 2.5 MG tablet TAKE 1 TABLET(2.5 MG) BY MOUTH DAILY 90 tablet 2  . Multiple Vitamin (MULTIVITAMIN WITH MINERALS) TABS Take 1 tablet by mouth daily.    . pravastatin (PRAVACHOL) 40 MG tablet Take 40 mg by mouth every evening.    . vitamin E 400 UNIT capsule Take 400 Units by mouth daily.     No current facility-administered medications for this visit.    PHYSICAL EXAMINATION: ECOG PERFORMANCE STATUS: 1 - Symptomatic but completely ambulatory  There were no vitals filed for this visit. There were no vitals filed for this visit.  BREAST: No palpable masses or nodules in either right or left breasts. No palpable axillary supraclavicular or infraclavicular adenopathy no breast tenderness or nipple discharge. (exam performed in the presence of a chaperone)  LABORATORY DATA:  I have reviewed the data as listed CMP Latest Ref Rng & Units 08/15/2018 01/26/2018 10/24/2012  Glucose 70 - 99 mg/dL 103(H) 131 106(H)  BUN 8 - 23 mg/dL  $'19 20 17  'A$ Creatinine 0.44 - 1.00 mg/dL 0.95 0.97 0.87  Sodium 135 - 145 mmol/L 141 142 139  Potassium 3.5 - 5.1 mmol/L 3.6 3.5 3.6  Chloride 98 - 111 mmol/L 103 105 101  CO2 22 - 32 mmol/L $RemoveB'28 27 27  'glXQzGFm$ Calcium 8.9 - 10.3 mg/dL 9.7 9.5 9.5  Total Protein 6.5 - 8.1 g/dL 7.5 7.3 -  Total Bilirubin 0.3 - 1.2 mg/dL 0.5 0.5 -  Alkaline Phos 38 - 126 U/L 53 48 -  AST 15 - 41 U/L 14(L) 15 -  ALT 0 - 44 U/L 13 14 -     Lab Results  Component Value Date   WBC 6.4 08/15/2018   HGB 14.1 08/15/2018   HCT 42.1 08/15/2018   MCV 89.2 08/15/2018   PLT 236 08/15/2018   NEUTROABS 4.3 08/15/2018    ASSESSMENT & PLAN:  Malignant neoplasm of lower-inner quadrant of left breast in female, estrogen receptor positive ([place]) 01/10/2018:Screening detected left breast mass ill-defined LIQ 3 cm at 8 o'clock position axilla negative, ultrasound biopsy: Invasive lobular cancer grade 1-2, ER 90%, PR 100%, Ki-67 10%, HER-2 negative, T2N0 stage Ib clinical stage AJCC 8  MRI Breast 02/01/18: Mass and NME extends 4.3 X 2.1 X 2.2 cm, No abnormal LN 09/13/2018:Left lumpectomy: Invasive lobular cancer, grade 1, microscopic focus, ALH, 0/2 lymph nodes negative, ER 90%, PR 100%, HER-2 negative, Ki-67 10%, ypTmic, ypN0. Near complete response  Current treatment: Adjuvant antiestrogen therapy with letrozole 2.5 mg daily started Feb 2019 (even before surgery) LetrozoleToxicities: Denies any hot flashes or myalgias.  Breast cancer surveillance: Mammogram 01/18/2019: Benign, breast density category B Breast exam 08/12/2020: Benign  Return to clinic in 1 year for follow-up    No orders of the defined types were placed in this encounter.  The patient has a good understanding of the overall plan. she agrees with it. she will call with any problems that may develop before the next visit here.  Total time spent: 20 mins including face to face time and time spent for planning, charting and coordination of care  Nicholas Lose, MD 08/12/2020  I, Cloyde Reams Dorshimer, am acting as scribe for Dr. Nicholas Lose.  I have reviewed the above documentation for accuracy and completeness, and I agree with the above.

## 2020-08-12 ENCOUNTER — Other Ambulatory Visit: Payer: Self-pay

## 2020-08-12 ENCOUNTER — Inpatient Hospital Stay: Payer: Medicare Other | Attending: Hematology and Oncology | Admitting: Hematology and Oncology

## 2020-08-12 DIAGNOSIS — Z79899 Other long term (current) drug therapy: Secondary | ICD-10-CM | POA: Insufficient documentation

## 2020-08-12 DIAGNOSIS — C50312 Malignant neoplasm of lower-inner quadrant of left female breast: Secondary | ICD-10-CM | POA: Insufficient documentation

## 2020-08-12 DIAGNOSIS — Z170 Estrogen receptor positive status [ER+]: Secondary | ICD-10-CM | POA: Insufficient documentation

## 2020-08-12 DIAGNOSIS — Z79811 Long term (current) use of aromatase inhibitors: Secondary | ICD-10-CM | POA: Insufficient documentation

## 2020-08-12 MED ORDER — ATORVASTATIN CALCIUM 20 MG PO TABS: 20.0000 mg | ORAL_TABLET | Freq: Every day | ORAL | Status: AC

## 2020-08-12 MED ORDER — LETROZOLE 2.5 MG PO TABS: 2.5000 mg | ORAL_TABLET | Freq: Every day | ORAL | 3 refills | Status: DC

## 2020-08-12 NOTE — Assessment & Plan Note (Signed)
01/10/2018:Screening detected left breast mass ill-defined LIQ 3 cm at 8 o'clock position axilla negative, ultrasound biopsy: Invasive lobular cancer grade 1-2, ER 90%, PR 100%, Ki-67 10%, HER-2 negative, T2N0 stage Ib clinical stage AJCC 8  MRI Breast 02/01/18: Mass and NME extends 4.3 X 2.1 X 2.2 cm, No abnormal LN 09/13/2018:Left lumpectomy: Invasive lobular cancer, grade 1, microscopic focus, ALH, 0/2 lymph nodes negative, ER 90%, PR 100%, HER-2 negative, Ki-67 10%, ypTmic, ypN0. Near complete response  Current treatment: Adjuvant antiestrogen therapy with letrozole 2.5 mg daily started Feb 2019 (even before surgery) LetrozoleToxicities: Denies any hot flashes or myalgias.  Breast cancer surveillance: Mammogram 01/18/2019: Benign, breast density category B Breast exam 08/12/2020: Benign  Return to clinic in 1 year for follow-up

## 2021-01-27 DIAGNOSIS — Z853 Personal history of malignant neoplasm of breast: Secondary | ICD-10-CM | POA: Diagnosis not present

## 2021-02-13 DIAGNOSIS — I129 Hypertensive chronic kidney disease with stage 1 through stage 4 chronic kidney disease, or unspecified chronic kidney disease: Secondary | ICD-10-CM | POA: Diagnosis not present

## 2021-02-13 DIAGNOSIS — E785 Hyperlipidemia, unspecified: Secondary | ICD-10-CM | POA: Diagnosis not present

## 2021-02-13 DIAGNOSIS — Z1159 Encounter for screening for other viral diseases: Secondary | ICD-10-CM | POA: Diagnosis not present

## 2021-02-13 DIAGNOSIS — N183 Chronic kidney disease, stage 3 unspecified: Secondary | ICD-10-CM | POA: Diagnosis not present

## 2021-02-13 DIAGNOSIS — Z0000 Encounter for general adult medical examination without abnormal findings: Secondary | ICD-10-CM | POA: Diagnosis not present

## 2021-03-19 DIAGNOSIS — H524 Presbyopia: Secondary | ICD-10-CM | POA: Diagnosis not present

## 2021-03-19 DIAGNOSIS — H2513 Age-related nuclear cataract, bilateral: Secondary | ICD-10-CM | POA: Diagnosis not present

## 2021-04-11 DIAGNOSIS — H18413 Arcus senilis, bilateral: Secondary | ICD-10-CM | POA: Diagnosis not present

## 2021-04-11 DIAGNOSIS — H25013 Cortical age-related cataract, bilateral: Secondary | ICD-10-CM | POA: Diagnosis not present

## 2021-04-11 DIAGNOSIS — H2513 Age-related nuclear cataract, bilateral: Secondary | ICD-10-CM | POA: Diagnosis not present

## 2021-04-11 DIAGNOSIS — H35373 Puckering of macula, bilateral: Secondary | ICD-10-CM | POA: Diagnosis not present

## 2021-06-02 DIAGNOSIS — H2512 Age-related nuclear cataract, left eye: Secondary | ICD-10-CM | POA: Diagnosis not present

## 2021-06-03 DIAGNOSIS — H2511 Age-related nuclear cataract, right eye: Secondary | ICD-10-CM | POA: Diagnosis not present

## 2021-07-07 DIAGNOSIS — H2511 Age-related nuclear cataract, right eye: Secondary | ICD-10-CM | POA: Diagnosis not present

## 2021-08-08 DIAGNOSIS — R7303 Prediabetes: Secondary | ICD-10-CM | POA: Diagnosis not present

## 2021-08-08 DIAGNOSIS — N183 Chronic kidney disease, stage 3 unspecified: Secondary | ICD-10-CM | POA: Diagnosis not present

## 2021-08-08 DIAGNOSIS — E785 Hyperlipidemia, unspecified: Secondary | ICD-10-CM | POA: Diagnosis not present

## 2021-08-08 DIAGNOSIS — I129 Hypertensive chronic kidney disease with stage 1 through stage 4 chronic kidney disease, or unspecified chronic kidney disease: Secondary | ICD-10-CM | POA: Diagnosis not present

## 2021-08-12 NOTE — Progress Notes (Signed)
Patient Care Team: Harlan Stains, MD as PCP - General (Family Medicine) Nicholas Lose, MD as Consulting Physician (Hematology and Oncology) Rolm Bookbinder, MD as Consulting Physician (General Surgery) Kyung Rudd, MD as Consulting Physician (Radiation Oncology)  DIAGNOSIS:    ICD-10-CM   1. Malignant neoplasm of lower-inner quadrant of left breast in female, estrogen receptor positive ([place])  C50.312    Z17.0       SUMMARY OF ONCOLOGIC HISTORY: Oncology History  Malignant neoplasm of lower-inner quadrant of left breast in female, estrogen receptor positive ([place])  01/10/2018 Initial Diagnosis   Screening detected left breast mass ill-defined LIQ 3 cm at 8 o'clock position axilla negative, ultrasound biopsy: Invasive lobular cancer grade 1-2, ER 90%, PR 100%, Ki-67 10%, HER-2 negative, T2N0 stage Ib clinical stage AJCC 8   02/08/2018 -  Anti-estrogen oral therapy   Letrozole Neoadj therapy   08/31/2018 Cancer Staging   Staging form: Breast, AJCC 8th Edition - Pathologic: No Stage Recommended (ypT60m, pN0, cM0, G1, ER+, PR+, HER2-) - Signed by CGardenia Phlegm NP on 09/28/2018   09/13/2018 Surgery   Left lumpectomy: Invasive lobular cancer, grade 1, microscopic focus, ALH, 0/2 lymph nodes negative, ER 90%, PR 100%, HER-2 negative, Ki-67 10%, ypTmic, ypN0.  Near complete response   10/20/2018 - 11/16/2018 Radiation Therapy    The patient initially received a dose of 42.56 Gy in 16 fractions to the breast using whole-breast tangent fields. This was delivered using a 3-D conformal technique. The patient then received a boost to the seroma. This delivered an additional 8 Gy in 4 fractions using a 3 field photon technique due to the depth of the seroma. The total dose was 50.56 Gy.     CHIEF COMPLIANT: Follow-up of left breast cancer on letrozole   INTERVAL HISTORY: Norma Simon a 80y.o. with above-mentioned history of  left breast cancer treated with lumpectomy,  radiation, and who is currently on anti-estrogen therapy with letrozole. She presents to the clinic today for follow-up.   ALLERGIES:  has No Known Allergies.  MEDICATIONS:  Current Outpatient Medications  Medication Sig Dispense Refill   amLODipine (NORVASC) 5 MG tablet Take 2.5 mg by mouth daily.      atorvastatin (LIPITOR) 20 MG tablet Take 1 tablet (20 mg total) by mouth daily.     benazepril-hydrochlorthiazide (LOTENSIN HCT) 20-25 MG per tablet Take 1 tablet by mouth daily.     calcium carbonate (OS-CAL) 600 MG TABS Take 600 mg by mouth daily.     cholecalciferol (VITAMIN D) 1000 UNITS tablet Take 1,000 Units by mouth daily.     letrozole (FEMARA) 2.5 MG tablet Take 1 tablet (2.5 mg total) by mouth daily. 90 tablet 3   Multiple Vitamin (MULTIVITAMIN WITH MINERALS) TABS Take 1 tablet by mouth daily.     vitamin E 400 UNIT capsule Take 400 Units by mouth daily.     No current facility-administered medications for this visit.    PHYSICAL EXAMINATION: ECOG PERFORMANCE STATUS: 1 - Symptomatic but completely ambulatory  Vitals:   08/13/21 0825  BP: (!) 147/68  Pulse: 68  Resp: 16  Temp: 98.1 F (36.7 C)  SpO2: 100%   Filed Weights   08/13/21 0825  Weight: 135 lb (61.2 kg)    BREAST: No palpable masses or nodules in either right or left breasts. No palpable axillary supraclavicular or infraclavicular adenopathy no breast tenderness or nipple discharge. (exam performed in the presence of a chaperone)  LABORATORY DATA:  I  have reviewed the data as listed CMP Latest Ref Rng & Units 08/15/2018 01/26/2018 10/24/2012  Glucose 70 - 99 mg/dL 103(H) 131 106(H)  BUN 8 - 23 mg/dL _0 Creatinine 0.44 - 1.00 mg/dL 0.95 0.97 0.87  Sodium 135 - 145 mmol/L 141 142 139  Potassium 3.5 - 5.1 mmol/L 3.6 3.5 3.6  Chloride 98 - 111 mmol/L 103 105 101  CO2 22 - 32 mmol/L _1 Calcium 8.9 - 10.3 mg/dL 9.7 9.5 9.5  Total Protein 6.5 - 8.1 g/dL 7.5 7.3 -  Total Bilirubin 0.3 - 1.2 mg/dL  0.5 0.5 -  Alkaline Phos 38 - 126 U/L 53 48 -  AST 15 - 41 U/L 14(L) 15 -  ALT 0 - 44 U/L 13 14 -    Lab Results  Component Value Date   WBC 6.4 08/15/2018   HGB 14.1 08/15/2018   HCT 42.1 08/15/2018   MCV 89.2 08/15/2018   PLT 236 08/15/2018   NEUTROABS 4.3 08/15/2018    ASSESSMENT & PLAN:  Malignant neoplasm of lower-inner quadrant of left breast in female, estrogen receptor positive ([place]) 01/10/2018: Screening detected left breast mass ill-defined LIQ 3 cm at 8 o'clock position axilla negative, ultrasound biopsy: Invasive lobular cancer grade 1-2, ER 90%, PR 100%, Ki-67 10%, HER-2 negative, T2N0 stage Ib clinical stage AJCC 8   MRI Breast 02/01/18: Mass and NME extends 4.3 X 2.1 X 2.2 cm, No abnormal LN 09/13/2018:Left lumpectomy: Invasive lobular cancer, grade 1, microscopic focus, ALH, 0/2 lymph nodes negative, ER 90%, PR 100%, HER-2 negative, Ki-67 10%, ypTmic, ypN0.  Near complete response   Current treatment: Adjuvant antiestrogen therapy with letrozole 2.5 mg daily started Feb 2019 (even before surgery) Letrozole Toxicities: Denies any hot flashes or myalgias.   Breast cancer surveillance: Mammogram 01/24/20: Benign, breast density category B Breast exam 08/13/2021: Benign   Return to clinic in 1 year for follow-up    No orders of the defined types were placed in this encounter.  The patient has a good understanding of the overall plan. she agrees with it. she will call with any problems that may develop before the next visit here.  Total time spent: 20 mins including face to face time and time spent for planning, charting and coordination of care  Rulon Eisenmenger, MD, MPH 08/13/2021  I, Thana Ates, am acting as scribe for Dr. Nicholas Lose.  I have reviewed the above documentation for accuracy and completeness, and I agree with the above.

## 2021-08-12 NOTE — Assessment & Plan Note (Signed)
01/10/2018:Screening detected left breast mass ill-defined LIQ 3 cm at 8 o'clock position axilla negative, ultrasound biopsy: Invasive lobular cancer grade 1-2, ER 90%, PR 100%, Ki-67 10%, HER-2 negative, T2N0 stage Ib clinical stage AJCC 8  MRI Breast 02/01/18: Mass and NME extends 4.3 X 2.1 X 2.2 cm, No abnormal LN 09/13/2018:Left lumpectomy: Invasive lobular cancer, grade 1, microscopic focus, ALH, 0/2 lymph nodes negative, ER 90%, PR 100%, HER-2 negative, Ki-67 10%, ypTmic, ypN0. Near complete response  Current treatment: Adjuvant antiestrogen therapy with letrozole 2.5 mg daily startedFeb 2019 (even before surgery) LetrozoleToxicities: Denies any hot flashes or myalgias.  Breast cancer surveillance: Mammogram2/3/21: Benign, breast density category B Breast exam 08/13/2021: Benign  Return to clinic in 1 year for follow-up

## 2021-08-13 ENCOUNTER — Other Ambulatory Visit: Payer: Self-pay

## 2021-08-13 ENCOUNTER — Inpatient Hospital Stay: Payer: Medicare Other | Attending: Hematology and Oncology | Admitting: Hematology and Oncology

## 2021-08-13 DIAGNOSIS — Z79899 Other long term (current) drug therapy: Secondary | ICD-10-CM | POA: Insufficient documentation

## 2021-08-13 DIAGNOSIS — C50312 Malignant neoplasm of lower-inner quadrant of left female breast: Secondary | ICD-10-CM | POA: Insufficient documentation

## 2021-08-13 DIAGNOSIS — Z79811 Long term (current) use of aromatase inhibitors: Secondary | ICD-10-CM | POA: Diagnosis not present

## 2021-08-13 DIAGNOSIS — Z170 Estrogen receptor positive status [ER+]: Secondary | ICD-10-CM | POA: Diagnosis not present

## 2021-08-13 MED ORDER — LETROZOLE 2.5 MG PO TABS: 2.5000 mg | ORAL_TABLET | Freq: Every day | ORAL | 3 refills | Status: DC

## 2021-08-20 DIAGNOSIS — M7062 Trochanteric bursitis, left hip: Secondary | ICD-10-CM | POA: Diagnosis not present

## 2022-01-01 ENCOUNTER — Other Ambulatory Visit: Payer: Self-pay | Admitting: Hematology and Oncology

## 2022-02-12 DIAGNOSIS — H01113 Allergic dermatitis of right eye, unspecified eyelid: Secondary | ICD-10-CM | POA: Diagnosis not present

## 2022-02-26 DIAGNOSIS — Z0000 Encounter for general adult medical examination without abnormal findings: Secondary | ICD-10-CM | POA: Diagnosis not present

## 2022-02-26 DIAGNOSIS — E559 Vitamin D deficiency, unspecified: Secondary | ICD-10-CM | POA: Diagnosis not present

## 2022-02-26 DIAGNOSIS — N183 Chronic kidney disease, stage 3 unspecified: Secondary | ICD-10-CM | POA: Diagnosis not present

## 2022-02-26 DIAGNOSIS — R7303 Prediabetes: Secondary | ICD-10-CM | POA: Diagnosis not present

## 2022-02-26 DIAGNOSIS — I129 Hypertensive chronic kidney disease with stage 1 through stage 4 chronic kidney disease, or unspecified chronic kidney disease: Secondary | ICD-10-CM | POA: Diagnosis not present

## 2022-02-26 DIAGNOSIS — E785 Hyperlipidemia, unspecified: Secondary | ICD-10-CM | POA: Diagnosis not present

## 2022-03-18 DIAGNOSIS — Z1231 Encounter for screening mammogram for malignant neoplasm of breast: Secondary | ICD-10-CM | POA: Diagnosis not present

## 2022-03-18 DIAGNOSIS — M85851 Other specified disorders of bone density and structure, right thigh: Secondary | ICD-10-CM | POA: Diagnosis not present

## 2022-03-18 DIAGNOSIS — Z853 Personal history of malignant neoplasm of breast: Secondary | ICD-10-CM | POA: Diagnosis not present

## 2022-03-18 DIAGNOSIS — M85852 Other specified disorders of bone density and structure, left thigh: Secondary | ICD-10-CM | POA: Diagnosis not present

## 2022-03-25 DIAGNOSIS — H04123 Dry eye syndrome of bilateral lacrimal glands: Secondary | ICD-10-CM | POA: Diagnosis not present

## 2022-03-25 DIAGNOSIS — H2513 Age-related nuclear cataract, bilateral: Secondary | ICD-10-CM | POA: Diagnosis not present

## 2022-08-18 NOTE — Progress Notes (Signed)
Patient Care Team: Laurann Montana, MD as PCP - General (Family Medicine) Serena Croissant, MD as Consulting Physician (Hematology and Oncology) Emelia Loron, MD as Consulting Physician (General Surgery) Dorothy Puffer, MD as Consulting Physician (Radiation Oncology)  DIAGNOSIS: No diagnosis found.  SUMMARY OF ONCOLOGIC HISTORY: Oncology History  Malignant neoplasm of lower-inner quadrant of left breast in female, estrogen receptor positive ([facility])  01/10/2018 Initial Diagnosis   Screening detected left breast mass ill-defined LIQ 3 cm at 8 o'clock position axilla negative, ultrasound biopsy: Invasive lobular cancer grade 1-2, ER 90%, PR 100%, Ki-67 10%, HER-2 negative, T2N0 stage Ib clinical stage AJCC 8   02/08/2018 -  Anti-estrogen oral therapy   Letrozole Neoadj therapy   08/31/2018 Cancer Staging   Staging form: Breast, AJCC 8th Edition - Pathologic: No Stage Recommended (ypT74mi, pN0, cM0, G1, ER+, PR+, HER2-) - Signed by Loa Socks, NP on 09/28/2018   09/13/2018 Surgery   Left lumpectomy: Invasive lobular cancer, grade 1, microscopic focus, ALH, 0/2 lymph nodes negative, ER 90%, PR 100%, HER-2 negative, Ki-67 10%, ypTmic, ypN0.  Near complete response   10/20/2018 - 11/16/2018 Radiation Therapy    The patient initially received a dose of 42.56 Gy in 16 fractions to the breast using whole-breast tangent fields. This was delivered using a 3-D conformal technique. The patient then received a boost to the seroma. This delivered an additional 8 Gy in 4 fractions using a 3 field photon technique due to the depth of the seroma. The total dose was 50.56 Gy.     CHIEF COMPLIANT: Follow-up of left breast cancer on letrozole     INTERVAL HISTORY: Norma Simon is a 81 y.o. with above-mentioned history of  left breast cancer treated with lumpectomy, radiation, and who is currently on anti-estrogen therapy with letrozole. She presents to the clinic today for follow-up.     ALLERGIES:  has No Known Allergies.  MEDICATIONS:  Current Outpatient Medications  Medication Sig Dispense Refill   amLODipine (NORVASC) 5 MG tablet Take 2.5 mg by mouth daily.      atorvastatin (LIPITOR) 20 MG tablet Take 1 tablet (20 mg total) by mouth daily.     benazepril-hydrochlorthiazide (LOTENSIN HCT) 20-25 MG per tablet Take 1 tablet by mouth daily.     calcium carbonate (OS-CAL) 600 MG TABS Take 600 mg by mouth daily.     cholecalciferol (VITAMIN D) 1000 UNITS tablet Take 1,000 Units by mouth daily.     letrozole (FEMARA) 2.5 MG tablet Take 1 tablet (2.5 mg total) by mouth daily. 90 tablet 3   Multiple Vitamin (MULTIVITAMIN WITH MINERALS) TABS Take 1 tablet by mouth daily.     vitamin E 400 UNIT capsule Take 400 Units by mouth daily.     No current facility-administered medications for this visit.    PHYSICAL EXAMINATION: ECOG PERFORMANCE STATUS: {CHL ONC ECOG [phone]}  There were no vitals filed for this visit. There were no vitals filed for this visit.  BREAST:*** No palpable masses or nodules in either right or left breasts. No palpable axillary supraclavicular or infraclavicular adenopathy no breast tenderness or nipple discharge. (exam performed in the presence of a chaperone)  LABORATORY DATA:  I have reviewed the data as listed    Latest Ref Rng & Units 08/15/2018    9:10 AM 01/26/2018    8:11 AM 10/24/2012    1:55 PM  CMP  Glucose 70 - 99 mg/dL 987  347  338   BUN 8 - 23 mg/dL  $'19  20  17   't$ Creatinine 0.44 - 1.00 mg/dL 0.95  0.97  0.87   Sodium 135 - 145 mmol/L 141  142  139   Potassium 3.5 - 5.1 mmol/L 3.6  3.5  3.6   Chloride 98 - 111 mmol/L 103  105  101   CO2 22 - 32 mmol/L $RemoveB'28  27  27   'yTdOaGBc$ Calcium 8.9 - 10.3 mg/dL 9.7  9.5  9.5   Total Protein 6.5 - 8.1 g/dL 7.5  7.3    Total Bilirubin 0.3 - 1.2 mg/dL 0.5  0.5    Alkaline Phos 38 - 126 U/L 53  48    AST 15 - 41 U/L 14  15    ALT 0 - 44 U/L 13  14      Lab Results  Component Value Date   WBC  6.4 08/15/2018   HGB 14.1 08/15/2018   HCT 42.1 08/15/2018   MCV 89.2 08/15/2018   PLT 236 08/15/2018   NEUTROABS 4.3 08/15/2018    ASSESSMENT & PLAN:  No problem-specific Assessment & Plan notes found for this encounter.    No orders of the defined types were placed in this encounter.  The patient has a good understanding of the overall plan. she agrees with it. she will call with any problems that may develop before the next visit here. Total time spent: 30 mins including face to face time and time spent for planning, charting and co-ordination of care   Suzzette Righter, [place] 08/18/22    I Gardiner Coins am scribing for Dr. Lindi Adie  ***

## 2022-08-19 ENCOUNTER — Inpatient Hospital Stay: Payer: Medicare Other | Attending: Hematology and Oncology | Admitting: Hematology and Oncology

## 2022-08-19 ENCOUNTER — Other Ambulatory Visit: Payer: Self-pay

## 2022-08-19 DIAGNOSIS — C50312 Malignant neoplasm of lower-inner quadrant of left female breast: Secondary | ICD-10-CM | POA: Diagnosis not present

## 2022-08-19 DIAGNOSIS — Z79811 Long term (current) use of aromatase inhibitors: Secondary | ICD-10-CM | POA: Insufficient documentation

## 2022-08-19 DIAGNOSIS — Z170 Estrogen receptor positive status [ER+]: Secondary | ICD-10-CM

## 2022-08-19 MED ORDER — LETROZOLE 2.5 MG PO TABS: 2.5000 mg | ORAL_TABLET | Freq: Every day | ORAL | 3 refills | Status: DC
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
## 2022-09-09 DIAGNOSIS — E785 Hyperlipidemia, unspecified: Secondary | ICD-10-CM | POA: Diagnosis not present

## 2022-09-09 DIAGNOSIS — N183 Chronic kidney disease, stage 3 unspecified: Secondary | ICD-10-CM | POA: Diagnosis not present

## 2022-09-09 DIAGNOSIS — I129 Hypertensive chronic kidney disease with stage 1 through stage 4 chronic kidney disease, or unspecified chronic kidney disease: Secondary | ICD-10-CM | POA: Diagnosis not present

## 2022-09-09 DIAGNOSIS — C50312 Malignant neoplasm of lower-inner quadrant of left female breast: Secondary | ICD-10-CM | POA: Diagnosis not present

## 2023-03-24 DIAGNOSIS — E785 Hyperlipidemia, unspecified: Secondary | ICD-10-CM | POA: Diagnosis not present

## 2023-03-24 DIAGNOSIS — E559 Vitamin D deficiency, unspecified: Secondary | ICD-10-CM | POA: Diagnosis not present

## 2023-03-24 DIAGNOSIS — C50312 Malignant neoplasm of lower-inner quadrant of left female breast: Secondary | ICD-10-CM | POA: Diagnosis not present

## 2023-03-24 DIAGNOSIS — I129 Hypertensive chronic kidney disease with stage 1 through stage 4 chronic kidney disease, or unspecified chronic kidney disease: Secondary | ICD-10-CM | POA: Diagnosis not present

## 2023-03-24 DIAGNOSIS — N183 Chronic kidney disease, stage 3 unspecified: Secondary | ICD-10-CM | POA: Diagnosis not present

## 2023-03-24 DIAGNOSIS — R7303 Prediabetes: Secondary | ICD-10-CM | POA: Diagnosis not present

## 2023-03-24 DIAGNOSIS — Z0000 Encounter for general adult medical examination without abnormal findings: Secondary | ICD-10-CM | POA: Diagnosis not present

## 2023-03-31 DIAGNOSIS — Z1231 Encounter for screening mammogram for malignant neoplasm of breast: Secondary | ICD-10-CM | POA: Diagnosis not present

## 2023-03-31 LAB — MM 3D SCREENING MAMMOGRAM BILATERAL BREAST

## 2023-04-02 ENCOUNTER — Encounter: Payer: Self-pay | Admitting: Hematology and Oncology

## 2023-08-25 ENCOUNTER — Inpatient Hospital Stay: Payer: Medicare Other | Admitting: Hematology and Oncology

## 2023-08-30 ENCOUNTER — Other Ambulatory Visit: Payer: Self-pay | Admitting: *Deleted

## 2023-08-30 ENCOUNTER — Inpatient Hospital Stay: Payer: Medicare Other | Attending: Hematology and Oncology | Admitting: Hematology and Oncology

## 2023-08-30 VITALS — BP 150/78 | HR 71 | Temp 98.0°F | Resp 18 | Wt 136.3 lb

## 2023-08-30 DIAGNOSIS — Z79811 Long term (current) use of aromatase inhibitors: Secondary | ICD-10-CM | POA: Diagnosis not present

## 2023-08-30 DIAGNOSIS — Z170 Estrogen receptor positive status [ER+]: Secondary | ICD-10-CM | POA: Diagnosis not present

## 2023-08-30 DIAGNOSIS — C50312 Malignant neoplasm of lower-inner quadrant of left female breast: Secondary | ICD-10-CM

## 2023-08-30 MED ORDER — LETROZOLE 2.5 MG PO TABS: 2.5000 mg | ORAL_TABLET | Freq: Every day | ORAL | 3 refills | Status: DC
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
## 2023-10-05 DIAGNOSIS — E785 Hyperlipidemia, unspecified: Secondary | ICD-10-CM | POA: Diagnosis not present

## 2023-10-05 DIAGNOSIS — Z23 Encounter for immunization: Secondary | ICD-10-CM | POA: Diagnosis not present

## 2023-10-05 DIAGNOSIS — N183 Chronic kidney disease, stage 3 unspecified: Secondary | ICD-10-CM | POA: Diagnosis not present

## 2023-10-05 DIAGNOSIS — C50312 Malignant neoplasm of lower-inner quadrant of left female breast: Secondary | ICD-10-CM | POA: Diagnosis not present

## 2023-10-05 DIAGNOSIS — I129 Hypertensive chronic kidney disease with stage 1 through stage 4 chronic kidney disease, or unspecified chronic kidney disease: Secondary | ICD-10-CM | POA: Diagnosis not present

## 2023-10-11 ENCOUNTER — Other Ambulatory Visit: Payer: Self-pay | Admitting: Hematology and Oncology

## 2023-10-11 DIAGNOSIS — H04123 Dry eye syndrome of bilateral lacrimal glands: Secondary | ICD-10-CM | POA: Diagnosis not present

## 2023-10-11 NOTE — Telephone Encounter (Signed)
Called and left message to clarify if patient is taking the letrozole. Advised patient to call back. Lorayne Marek, RN

## 2023-10-11 NOTE — Telephone Encounter (Signed)
Need to clarify how long he wants her on letrozole before refill. Lorayne Marek, RN

## 2023-12-01 DIAGNOSIS — J40 Bronchitis, not specified as acute or chronic: Secondary | ICD-10-CM | POA: Diagnosis not present

## 2024-04-05 DIAGNOSIS — Z1231 Encounter for screening mammogram for malignant neoplasm of breast: Secondary | ICD-10-CM | POA: Diagnosis not present

## 2024-04-07 ENCOUNTER — Encounter: Payer: Self-pay | Admitting: Hematology and Oncology

## 2024-04-12 DIAGNOSIS — E559 Vitamin D deficiency, unspecified: Secondary | ICD-10-CM | POA: Diagnosis not present

## 2024-04-12 DIAGNOSIS — M8588 Other specified disorders of bone density and structure, other site: Secondary | ICD-10-CM | POA: Diagnosis not present

## 2024-04-12 DIAGNOSIS — I129 Hypertensive chronic kidney disease with stage 1 through stage 4 chronic kidney disease, or unspecified chronic kidney disease: Secondary | ICD-10-CM | POA: Diagnosis not present

## 2024-04-12 DIAGNOSIS — R7303 Prediabetes: Secondary | ICD-10-CM | POA: Diagnosis not present

## 2024-04-12 DIAGNOSIS — Z0000 Encounter for general adult medical examination without abnormal findings: Secondary | ICD-10-CM | POA: Diagnosis not present

## 2024-04-12 DIAGNOSIS — E785 Hyperlipidemia, unspecified: Secondary | ICD-10-CM | POA: Diagnosis not present

## 2024-04-12 DIAGNOSIS — N183 Chronic kidney disease, stage 3 unspecified: Secondary | ICD-10-CM | POA: Diagnosis not present

## 2024-04-12 DIAGNOSIS — Z1331 Encounter for screening for depression: Secondary | ICD-10-CM | POA: Diagnosis not present

## 2024-04-13 DIAGNOSIS — R0789 Other chest pain: Secondary | ICD-10-CM | POA: Diagnosis not present

## 2024-04-24 DIAGNOSIS — M8588 Other specified disorders of bone density and structure, other site: Secondary | ICD-10-CM | POA: Diagnosis not present

## 2024-04-24 DIAGNOSIS — R29890 Loss of height: Secondary | ICD-10-CM | POA: Diagnosis not present

## 2024-05-18 DIAGNOSIS — M8588 Other specified disorders of bone density and structure, other site: Secondary | ICD-10-CM | POA: Diagnosis not present

## 2024-05-18 DIAGNOSIS — I129 Hypertensive chronic kidney disease with stage 1 through stage 4 chronic kidney disease, or unspecified chronic kidney disease: Secondary | ICD-10-CM | POA: Diagnosis not present

## 2024-05-18 DIAGNOSIS — F439 Reaction to severe stress, unspecified: Secondary | ICD-10-CM | POA: Diagnosis not present

## 2024-08-29 ENCOUNTER — Ambulatory Visit: Payer: Medicare Other | Admitting: Hematology and Oncology

## 2024-08-30 ENCOUNTER — Other Ambulatory Visit: Payer: Self-pay | Admitting: Hematology and Oncology

## 2024-10-03 DIAGNOSIS — M8588 Other specified disorders of bone density and structure, other site: Secondary | ICD-10-CM | POA: Diagnosis not present

## 2024-10-03 DIAGNOSIS — E785 Hyperlipidemia, unspecified: Secondary | ICD-10-CM | POA: Diagnosis not present

## 2024-10-03 DIAGNOSIS — Z23 Encounter for immunization: Secondary | ICD-10-CM | POA: Diagnosis not present

## 2024-10-03 DIAGNOSIS — N183 Chronic kidney disease, stage 3 unspecified: Secondary | ICD-10-CM | POA: Diagnosis not present

## 2024-10-03 DIAGNOSIS — I129 Hypertensive chronic kidney disease with stage 1 through stage 4 chronic kidney disease, or unspecified chronic kidney disease: Secondary | ICD-10-CM | POA: Diagnosis not present

## 2024-10-26 DIAGNOSIS — H35363 Drusen (degenerative) of macula, bilateral: Secondary | ICD-10-CM | POA: Diagnosis not present

## 2024-10-26 DIAGNOSIS — H04123 Dry eye syndrome of bilateral lacrimal glands: Secondary | ICD-10-CM | POA: Diagnosis not present
